# Patient Record
Sex: Female | Born: 1995 | Race: Black or African American | Hispanic: No | Marital: Married | Smoking: Never smoker
Health system: Southern US, Community
[De-identification: ages and names within clinical notes are randomized; demographics above are authoritative.]

## PROBLEM LIST (undated history)

## (undated) DIAGNOSIS — D509 Iron deficiency anemia, unspecified: Secondary | ICD-10-CM

## (undated) DIAGNOSIS — I10 Essential (primary) hypertension: Secondary | ICD-10-CM

## (undated) DIAGNOSIS — R8271 Bacteriuria: Secondary | ICD-10-CM

## (undated) DIAGNOSIS — O139 Gestational [pregnancy-induced] hypertension without significant proteinuria, unspecified trimester: Secondary | ICD-10-CM

## (undated) DIAGNOSIS — A749 Chlamydial infection, unspecified: Secondary | ICD-10-CM

## (undated) HISTORY — DX: Gestational (pregnancy-induced) hypertension without significant proteinuria, unspecified trimester: O13.9

---

## 2013-05-11 ENCOUNTER — Encounter: Payer: Self-pay | Admitting: Obstetrics & Gynecology

## 2013-05-11 ENCOUNTER — Ambulatory Visit (INDEPENDENT_AMBULATORY_CARE_PROVIDER_SITE_OTHER): Payer: Medicaid Other | Admitting: Obstetrics & Gynecology

## 2013-05-11 VITALS — BP 127/82 | Temp 98.6°F | Ht 60.0 in | Wt 148.0 lb

## 2013-05-11 DIAGNOSIS — Z3402 Encounter for supervision of normal first pregnancy, second trimester: Secondary | ICD-10-CM

## 2013-05-11 DIAGNOSIS — Z3400 Encounter for supervision of normal first pregnancy, unspecified trimester: Secondary | ICD-10-CM | POA: Insufficient documentation

## 2013-05-11 LAB — POCT URINALYSIS DIPSTICK
Ketones, UA: NEGATIVE
Spec Grav, UA: 1.02
Urobilinogen, UA: NEGATIVE
pH, UA: 7

## 2013-05-11 MED ORDER — SULFAMETHOXAZOLE-TRIMETHOPRIM 800-160 MG PO TABS: 1.0000 | ORAL_TABLET | Freq: Two times a day (BID) | ORAL | Status: DC

## 2013-05-11 NOTE — Patient Instructions (Addendum)
CF Gene Mutation Testing This is a test used to detect cystic fibrosis (CF) genetic mutations to establish CF carrier status or to establish the diagnosis of CF in an individual. The CF gene mutation test identifies mutations in the CFTR gene on chromosome 7. Each cell in the human body (except sperm and eggs) has 46 chromosomes (23 inherited from the mother and 23 from the father). Genes on these chromosomes form the body's blueprint for producing proteins that control body functions. Cystic fibrosis is caused by a mutation in a pair of genes located on chromosomes 7. Both copies of this gene must be abnormal to cause CF. If only one copy of the gene pair is mutated, the patient will be a carrier. Carriers are not ill, they do not have any symptoms, but they can pass their abnormal CF gene copy on to their children.  When a newborn infant has meconium ileus (no stools in the first 24 to 48 hours of life) or when a person has symptoms of CF (salty sweat, persistent respiratory infections, wheezing, persistent diarrhea, foul-smelling greasy stools, malnutrition, and vitamin deficiency); if a person has a positive sweat chloride or IRT test or a close relative who has been diagnosed with CF; when a patient is undergoing genetic counseling and wants to find out if they are a CF carrier; or for prenatal diagnosis. PREPARATION FOR TEST A blood sample drawn from an infant's heel; a spot of blood that is put onto filter paper; or a blood sample is drawn from a vein in the arm. NORMAL FINDINGS No genetic mutation. Ranges for normal findings may vary among different laboratories and hospitals. You should always check with your doctor after having lab work or other tests done to discuss the meaning of your test results and whether your values are considered within normal limits. MEANING OF TEST Your caregiver will go over the test results with you and discuss the importance and meaning of your results, as well as  treatment options and the need for additional tests if necessary. OBTAINING THE TEST RESULTS It is your responsibility to obtain your test results. Ask the lab or department performing the test when and how you will get your results. Document Released: 11/29/2004 Document Revised: 01/28/2012 Document Reviewed: 10/13/2008 [employer] Patient Information 2014 [employer]. AFP Maternal This is a routine screen (tests) used to check for fetal abnormalities such as Down syndrome and neural tube defects. Down Syndrome is a chromosomal abnormality, sometimes called Trisomy 21. Neural tube defects are serious birth defects. The brain, spinal cord, or their coverings do not develop completely. Women should be tested in the 15th to 20th week of pregnancy. The msAFP screen involves three or four tests that measure substances found in the blood that make the testing better. During development, AFP levels in fetal blood and amniotic fluid rise until about 12 weeks. The levels then gradually fall until birth. AFP is a protein produce by fetal tissue. AFP crosses the placenta and appears in the maternal blood. A baby with an open neural tube defect has an opening in its spine, head, or abdominal wall that allows higher-than-usual amounts of AFP to pass into the mother's blood. If a screen is positive, more tests are needed to make a diagnosis. These include ultrasound and perhaps amniocentesis (checking the fluid that surrounds the baby). These tests are used to help women and their caregivers make decisions about the management of their pregnancies. In pregnancies where the fetus is carrying the chromosomal   defect that results in Down syndrome, the levels of AFP and unconjugated estriol tend to be low and hCG and inhibin A levels high.  PREPARATION FOR TEST Blood is drawn from a vein in your arm usually between the 15th and 20th weeks of pregnancy. Four different tests on your blood are done. These are AFP, hCG,  unconjugated estriol, and inhibin A. The combination of tests produces a more accurate result. NORMAL FINDINGS   Adult: less than 40ng/mL or less than 40 mg/L (SI units)  Child younger than1 year: less than 30 ng/mL Ranges are stratified by weeks of gestation and vary among laboratories. Ranges for normal findings may vary among different laboratories and hospitals. You should always check with your doctor after having lab work or other tests done to discuss the meaning of your test results and whether your values are considered within normal limits. MEANING OF TEST  These are screening tests. Not all fetal abnormalities will give positive test results. Of all women who have positive AFP screening results, only a very small number of them have babies who actually have a neural tube defect or chromosomal abnormality. Your caregiver will go over the test results with you and discuss the importance and meaning of your results, as well as treatment options and the need for additional tests if necessary. OBTAINING THE TEST RESULTS It is your responsibility to obtain your test results. Ask the lab or department performing the test when and how you will get your results. Document Released: 11/27/2004 Document Revised: 01/28/2012 Document Reviewed: 10/09/2008 [employer] Patient Information 2014 [employer].  

## 2013-05-11 NOTE — Progress Notes (Signed)
Pulse- 105 . Subjective:    Doris Lowe is being seen today for her first obstetrical visit.  This is not a planned pregnancy. She is at [redacted]w[redacted]d gestation. Her obstetrical history is significant for none. Relationship with FOB: significant other, not living together. Patient does intend to breast feed. Pregnancy history fully reviewed.  Menstrual History: OB History   Grav Para Term Preterm Abortions TAB SAB Ect Mult Living   1               Menarche age: 17 Patient's last menstrual period was 02/07/2013.    The following portions of the patient's history were reviewed and updated as appropriate: allergies, current medications, past family history, past medical history, past social history, past surgical history and problem list.  Review of Systems Pertinent items are noted in HPI.    Objective:   General Appearance:    Alert, cooperative, no distress, appears stated age  Head:    Normocephalic, without obvious abnormality, atraumatic  Eyes:    PERRL, conjunctiva/corneas clear, EOM's intact, fundi    benign, both eyes  Ears:    Normal TM's and external ear canals, both ears  Nose:   Nares normal, septum midline, mucosa normal, no drainage    or sinus tenderness  Throat:   Lips, mucosa, and tongue normal; teeth and gums normal  Neck:   Supple, symmetrical, trachea midline, no adenopathy;    thyroid:  no enlargement/tenderness/nodules; no carotid   bruit or JVD  Back:     Symmetric, no curvature, ROM normal, no CVA tenderness  Lungs:     Clear to auscultation bilaterally, respirations unlabored  Chest Wall:    No tenderness or deformity   Heart:    Regular rate and rhythm, S1 and S2 normal, no murmur, rub   or gallop  Breast Exam:    No tenderness, masses, or nipple abnormality  Abdomen:     Soft, non-tender, bowel sounds active all four quadrants,    no masses, no organomegaly  Genitalia:    Normal female without lesion, discharge or tenderness  Extremities:   Extremities  normal, atraumatic, no cyanosis or edema  Pulses:   2+ and symmetric all extremities  Skin:   Skin color, texture, turgor normal, no rashes or lesions  Lymph nodes:   Cervical, supraclavicular, and axillary nodes normal  Neurologic:   CNII-XII intact, normal strength, sensation and reflexes    throughout    Assessment:    Pregnancy at [redacted]w[redacted]d weeks    Plan:    Initial labs drawn. Prenatal vitamins. Problem list reviewed and updated. AFP3 discussed: undecided. Role of ultrasound in pregnancy discussed; fetal survey: schedule next visit. Amniocentesis discussed: not indicated. Follow up in 4 weeks. 50% of 20 min visit spent on counseling and coordination of care.

## 2013-05-12 LAB — OBSTETRIC PANEL
Eosinophils Absolute: 0.1 10*3/uL (ref 0.0–0.7)
Eosinophils Relative: 0 % (ref 0–5)
HCT: 33.8 % — ABNORMAL LOW (ref 36.0–46.0)
Hemoglobin: 10.9 g/dL — ABNORMAL LOW (ref 12.0–15.0)
Lymphocytes Relative: 14 % (ref 12–46)
Lymphs Abs: 2.1 10*3/uL (ref 0.7–4.0)
MCH: 26.1 pg (ref 26.0–34.0)
MCV: 81.1 fL (ref 78.0–100.0)
Monocytes Absolute: 1.2 10*3/uL — ABNORMAL HIGH (ref 0.1–1.0)
Monocytes Relative: 8 % (ref 3–12)
RBC: 4.17 MIL/uL (ref 3.87–5.11)
Rh Type: POSITIVE
Rubella: 3.21 Index — ABNORMAL HIGH (ref <0.90)
WBC: 15.1 10*3/uL — ABNORMAL HIGH (ref 4.0–10.5)

## 2013-05-12 LAB — VITAMIN D 25 HYDROXY (VIT D DEFICIENCY, FRACTURES): Vit D, 25-Hydroxy: 31 ng/mL (ref 30–89)

## 2013-05-12 LAB — HIV ANTIBODY (ROUTINE TESTING W REFLEX): HIV: NONREACTIVE

## 2013-05-12 LAB — GC/CHLAMYDIA PROBE AMP
CT Probe RNA: POSITIVE — AB
GC Probe RNA: NEGATIVE

## 2013-05-12 LAB — WET PREP BY MOLECULAR PROBE: Candida species: NEGATIVE

## 2013-05-13 LAB — CULTURE, OB URINE: Colony Count: >=100000

## 2013-05-13 LAB — HEMOGLOBINOPATHY EVALUATION
Hgb A: 97.4 % (ref 96.8–97.8)
Hgb S Quant: 0 %

## 2013-05-15 ENCOUNTER — Encounter: Payer: Self-pay | Admitting: Obstetrics & Gynecology

## 2013-05-15 DIAGNOSIS — R8271 Bacteriuria: Secondary | ICD-10-CM | POA: Insufficient documentation

## 2013-05-15 DIAGNOSIS — O98819 Other maternal infectious and parasitic diseases complicating pregnancy, unspecified trimester: Secondary | ICD-10-CM

## 2013-05-15 DIAGNOSIS — A749 Chlamydial infection, unspecified: Secondary | ICD-10-CM | POA: Insufficient documentation

## 2013-05-15 DIAGNOSIS — O9989 Other specified diseases and conditions complicating pregnancy, childbirth and the puerperium: Secondary | ICD-10-CM

## 2013-05-21 ENCOUNTER — Telehealth: Payer: Self-pay | Admitting: *Deleted

## 2013-05-21 DIAGNOSIS — A749 Chlamydial infection, unspecified: Secondary | ICD-10-CM

## 2013-05-21 DIAGNOSIS — N390 Urinary tract infection, site not specified: Secondary | ICD-10-CM

## 2013-05-21 MED ORDER — AMPICILLIN 500 MG PO CAPS: 500.0000 mg | ORAL_CAPSULE | Freq: Four times a day (QID) | ORAL | Status: AC

## 2013-05-21 MED ORDER — AZITHROMYCIN 250 MG PO TABS: 500.0000 mg | ORAL_TABLET | Freq: Once | ORAL | Status: DC

## 2013-05-21 NOTE — Telephone Encounter (Signed)
Patient notified regarding test results- unable to reach pharmacy on phone- medication Escribed

## 2013-06-08 ENCOUNTER — Other Ambulatory Visit: Payer: Self-pay

## 2013-06-08 ENCOUNTER — Ambulatory Visit (INDEPENDENT_AMBULATORY_CARE_PROVIDER_SITE_OTHER): Payer: Medicaid Other | Admitting: Obstetrics & Gynecology

## 2013-06-08 VITALS — BP 126/78 | Temp 98.8°F | Wt 150.0 lb

## 2013-06-08 DIAGNOSIS — Z3402 Encounter for supervision of normal first pregnancy, second trimester: Secondary | ICD-10-CM

## 2013-06-08 DIAGNOSIS — Z3400 Encounter for supervision of normal first pregnancy, unspecified trimester: Secondary | ICD-10-CM

## 2013-06-08 LAB — POCT URINALYSIS DIPSTICK
Bilirubin, UA: NEGATIVE
Blood, UA: NEGATIVE
Leukocytes, UA: NEGATIVE
Nitrite, UA: NEGATIVE
Urobilinogen, UA: NEGATIVE
pH, UA: 6

## 2013-06-08 NOTE — Progress Notes (Signed)
P- 98 Pt states she has had a few cramps here and there.

## 2013-06-08 NOTE — Patient Instructions (Addendum)
CF Gene Mutation Testing This is a test used to detect cystic fibrosis (CF) genetic mutations to establish CF carrier status or to establish the diagnosis of CF in an individual. The CF gene mutation test identifies mutations in the CFTR gene on chromosome 7. Each cell in the human body (except sperm and eggs) has 46 chromosomes (23 inherited from the mother and 23 from the father). Genes on these chromosomes form the body's blueprint for producing proteins that control body functions. Cystic fibrosis is caused by a mutation in a pair of genes located on chromosomes 7. Both copies of this gene must be abnormal to cause CF. If only one copy of the gene pair is mutated, the patient will be a carrier. Carriers are not ill, they do not have any symptoms, but they can pass their abnormal CF gene copy on to their children.  When a newborn infant has meconium ileus (no stools in the first 24 to 48 hours of life) or when a person has symptoms of CF (salty sweat, persistent respiratory infections, wheezing, persistent diarrhea, foul-smelling greasy stools, malnutrition, and vitamin deficiency); if a person has a positive sweat chloride or IRT test or a close relative who has been diagnosed with CF; when a patient is undergoing genetic counseling and wants to find out if they are a CF carrier; or for prenatal diagnosis. PREPARATION FOR TEST A blood sample drawn from an infant's heel; a spot of blood that is put onto filter paper; or a blood sample is drawn from a vein in the arm. NORMAL FINDINGS No genetic mutation. Ranges for normal findings may vary among different laboratories and hospitals. You should always check with your doctor after having lab work or other tests done to discuss the meaning of your test results and whether your values are considered within normal limits. MEANING OF TEST Your caregiver will go over the test results with you and discuss the importance and meaning of your results, as well as  treatment options and the need for additional tests if necessary. OBTAINING THE TEST RESULTS It is your responsibility to obtain your test results. Ask the lab or department performing the test when and how you will get your results. Document Released: 11/29/2004 Document Revised: 01/28/2012 Document Reviewed: 10/13/2008 [employer] Patient Information 2014 [employer]. AFP Maternal This is a routine screen (tests) used to check for fetal abnormalities such as Down syndrome and neural tube defects. Down Syndrome is a chromosomal abnormality, sometimes called Trisomy 21. Neural tube defects are serious birth defects. The brain, spinal cord, or their coverings do not develop completely. Women should be tested in the 15th to 20th week of pregnancy. The msAFP screen involves three or four tests that measure substances found in the blood that make the testing better. During development, AFP levels in fetal blood and amniotic fluid rise until about 12 weeks. The levels then gradually fall until birth. AFP is a protein produce by fetal tissue. AFP crosses the placenta and appears in the maternal blood. A baby with an open neural tube defect has an opening in its spine, head, or abdominal wall that allows higher-than-usual amounts of AFP to pass into the mother's blood. If a screen is positive, more tests are needed to make a diagnosis. These include ultrasound and perhaps amniocentesis (checking the fluid that surrounds the baby). These tests are used to help women and their caregivers make decisions about the management of their pregnancies. In pregnancies where the fetus is carrying the chromosomal   defect that results in Down syndrome, the levels of AFP and unconjugated estriol tend to be low and hCG and inhibin A levels high.  PREPARATION FOR TEST Blood is drawn from a vein in your arm usually between the 15th and 20th weeks of pregnancy. Four different tests on your blood are done. These are AFP, hCG,  unconjugated estriol, and inhibin A. The combination of tests produces a more accurate result. NORMAL FINDINGS   Adult: less than 40ng/mL or less than 40 mg/L (SI units)  Child younger than1 year: less than 30 ng/mL Ranges are stratified by weeks of gestation and vary among laboratories. Ranges for normal findings may vary among different laboratories and hospitals. You should always check with your doctor after having lab work or other tests done to discuss the meaning of your test results and whether your values are considered within normal limits. MEANING OF TEST  These are screening tests. Not all fetal abnormalities will give positive test results. Of all women who have positive AFP screening results, only a very small number of them have babies who actually have a neural tube defect or chromosomal abnormality. Your caregiver will go over the test results with you and discuss the importance and meaning of your results, as well as treatment options and the need for additional tests if necessary. OBTAINING THE TEST RESULTS It is your responsibility to obtain your test results. Ask the lab or department performing the test when and how you will get your results. Document Released: 11/27/2004 Document Revised: 01/28/2012 Document Reviewed: 10/09/2008 [employer] Patient Information 2014 [employer].  

## 2013-06-09 ENCOUNTER — Encounter: Payer: Self-pay | Admitting: Obstetrics & Gynecology

## 2013-06-09 NOTE — Progress Notes (Signed)
Doing well 

## 2013-06-10 LAB — CYSTIC FIBROSIS DIAGNOSTIC STUDY

## 2013-06-16 ENCOUNTER — Ambulatory Visit (INDEPENDENT_AMBULATORY_CARE_PROVIDER_SITE_OTHER): Payer: Medicaid Other

## 2013-06-16 ENCOUNTER — Encounter: Payer: Self-pay | Admitting: Obstetrics & Gynecology

## 2013-06-16 ENCOUNTER — Other Ambulatory Visit: Payer: Self-pay | Admitting: *Deleted

## 2013-06-16 DIAGNOSIS — O280 Abnormal hematological finding on antenatal screening of mother: Secondary | ICD-10-CM | POA: Insufficient documentation

## 2013-06-16 DIAGNOSIS — Z3402 Encounter for supervision of normal first pregnancy, second trimester: Secondary | ICD-10-CM

## 2013-06-16 DIAGNOSIS — Z3400 Encounter for supervision of normal first pregnancy, unspecified trimester: Secondary | ICD-10-CM

## 2013-06-17 LAB — AFP, QUAD SCREEN
AFP: 22.7 IU/mL
Age Alone: 1:1190 {titer}
Curr Gest Age: 15.4 wks.days
Down Syndrome Scr Risk Est: 1:113 {titer}
INH: 327 pg/mL
MoM for INH: 1.7
Osb Risk: <1:27300 {titer}
uE3 Mom: 0.64

## 2013-06-26 ENCOUNTER — Encounter: Payer: Self-pay | Admitting: Obstetrics & Gynecology

## 2013-06-26 LAB — US OB DETAIL + 14 WK

## 2013-07-02 ENCOUNTER — Ambulatory Visit (INDEPENDENT_AMBULATORY_CARE_PROVIDER_SITE_OTHER): Payer: Medicaid Other | Admitting: Obstetrics & Gynecology

## 2013-07-02 ENCOUNTER — Encounter: Payer: Self-pay | Admitting: Obstetrics & Gynecology

## 2013-07-02 VITALS — BP 130/83 | Temp 98.5°F | Wt 156.8 lb

## 2013-07-02 DIAGNOSIS — Z3400 Encounter for supervision of normal first pregnancy, unspecified trimester: Secondary | ICD-10-CM

## 2013-07-02 DIAGNOSIS — Z3402 Encounter for supervision of normal first pregnancy, second trimester: Secondary | ICD-10-CM

## 2013-07-02 LAB — POCT URINALYSIS DIPSTICK
Blood, UA: NEGATIVE
Clarity, UA: NEGATIVE
Ketones, UA: NEGATIVE
Protein, UA: NEGATIVE
Spec Grav, UA: 1.015
Urobilinogen, UA: NEGATIVE
pH, UA: 8

## 2013-07-02 NOTE — Progress Notes (Signed)
Pulse: 108

## 2013-07-06 ENCOUNTER — Other Ambulatory Visit: Payer: Self-pay | Admitting: *Deleted

## 2013-07-06 ENCOUNTER — Encounter: Payer: Self-pay | Admitting: Obstetrics & Gynecology

## 2013-07-06 DIAGNOSIS — Z3689 Encounter for other specified antenatal screening: Secondary | ICD-10-CM

## 2013-07-06 NOTE — Progress Notes (Signed)
Referral-->genetics counseling. Possible candidate for NIPT.

## 2013-07-06 NOTE — Patient Instructions (Signed)
Genetic Counseling Genetic counseling helps people look at the risks of genetic problems in pregnancy. Genetic problems are problems that may be passed from the mother and father to their child.  Genetic counseling is done:  To see if a couple is at risk of passing on a problem.  To explain the cause of a disorder if present.  To understand the odds passing it on to the baby.  To determine what can be done medically.  To use in family planning to prevent or avoid a problem. Genetic counseling can be done by:  A geneticist.  A physician with special training in genetics.  Genetic counselors. The following are reasons to seek a referral for genetic counseling and/or genetic evaluation with a genetic physician: FAMILY HISTORY FACTORS   Mental retardation.  Neural tube defects (such as spina bifida).  Chromosome abnormalities (such as Down syndrome, Fragile X syndrome).  Cleft lip/cleft palate.  Heart defects.  Short stature.  Single gene defects (such as cystic fibrosis or PKU).  Hearing or visual impairments.  Learning disabilities.  Psychiatric disorders.  Cancers (breast, uterus, ovary and intestine).  Multiple pregnancy losses (miscarriages, stillbirths, or infant deaths).  Other disorders which could be considered genetic.  Either parent with a dominant disorder or any disorder seen in several generations including:  High blood pressure.  High cholesterol.  Muscular dystrophy.  Huntington's chorea.  Polycystic kidney. IF BOTH PARENTS ARE CARRIERS FOR:   Depression.  Seizures (convulsions).  Alcoholism.  Diabetes.  Thyroid disorder.  Others in which birth defects may be associated either with the disease process or with common medications prescribed for the disease.  Fetal or parental exposure to potentially harmful agents. These include:  Drugs.  Chemicals.  Infection.  Radiation.  Infertility cases where either parent is suspected  of having a chromosome abnormality.  Couples requiring assisted reproductive techniques to achieve a pregnancy, or individuals donating eggs or sperm for those purposes. OTHER FACTORS   Persons in specific ethnic groups or geographic areas with a higher incidence of certain disorders. These include Tay Sachs disease, sickle cell disease, or thalassemia.  Extreme parental concern or fear of having a child with a birth defect.  Parents are blood relatives (consanguinity) or incest in a pregnancy is involved.  Premarital or preconception counseling in couples at high risk for genetic disorders based on family or personal medical history.  The birth of an affected child or by carrier screening.  Mother, known, or presumed carrier of an X-linked disorder such as hemophilia.  Either parent is a known carrier of a balanced chromosome abnormality.  A previous baby with a genetic disorder.  After the baby is born, you find out the baby has a genetic disorder. PREGNANCY FACTORS   The mother is over 35 years old a the time of delivery.  Maternal serum screening indicating an increased risk for neural tube defects, Down Syndrome, or trisomy 18.  Abnormal prenatal diagnostic test results or abnormal prenatal ultrasound examination.  Maternal factors such as:  Schizophrenia.  Seizures.  Depression.  Alcoholism.  Diabetes.  Thyroid disorder.  Other factors in which birth defects may be associated either with the disease process or with common medications prescribed for the disease.  Fetal or parental exposure to potentially harmful agents such as:  Drugs.  Chemicals.  Radiation.  Infection.  The father is over the age of 55 years at the time of conception.  Infertility cases where either parent is suspected of having a chromosome abnormality.    Couples requiring assisted reproductive techniques to achieve a pregnancy, or individuals donating eggs or sperm for those  purposes.  Recurrent pregnancy loss.  Stillborn baby. FINDING A GENETIC COUNSELOR Most caregivers know and have a list of genetic counselors. Your caregiver can refer you to one of them. You can also contact the National Society of Genetic Counselors for their locations and for more information.  AFTER RECEIVING GENETIC COUNSELING Genetic counselors can help you understand what options you have. They can help you understand what to do next. They can share the experiences of other couples who had babies with genetic problems. If you are at high risk for having a baby with a severe or fatal genetic disorder, you have several options:  Have in vitro fertilization with healthy eggs that were fertilized in vitro.  Use donor sperm or eggs.  Adopt a baby. Genetic counselors can also help if you find out when you are pregnant that the baby has a severe or fatal genetic disorder. Some options are:  Help you prepare for a particular disorder by speaking to:  Specialists in caring for Down syndrome.  Surgeon for heart defects.  Social workers.  Support groups.  Fetal surgery is sometimes possible and helpful to treat some genetic defects.  Ending the pregnancy. Document Released: 01/26/2003 Document Revised: 01/28/2012 Document Reviewed: 12/17/2007 [employer] Patient Information 2014 [employer].  

## 2013-07-07 ENCOUNTER — Ambulatory Visit (INDEPENDENT_AMBULATORY_CARE_PROVIDER_SITE_OTHER): Payer: Medicaid Other

## 2013-07-07 DIAGNOSIS — Z3689 Encounter for other specified antenatal screening: Secondary | ICD-10-CM

## 2013-07-07 DIAGNOSIS — Z3400 Encounter for supervision of normal first pregnancy, unspecified trimester: Secondary | ICD-10-CM

## 2013-07-08 ENCOUNTER — Encounter: Payer: Self-pay | Admitting: Obstetrics & Gynecology

## 2013-07-08 LAB — US OB DETAIL + 14 WK

## 2013-07-09 ENCOUNTER — Encounter (HOSPITAL_COMMUNITY): Payer: Self-pay

## 2013-07-09 ENCOUNTER — Encounter (HOSPITAL_COMMUNITY): Payer: Self-pay | Admitting: Obstetrics

## 2013-07-09 ENCOUNTER — Other Ambulatory Visit (HOSPITAL_COMMUNITY): Payer: Self-pay

## 2013-07-14 ENCOUNTER — Other Ambulatory Visit: Payer: Self-pay | Admitting: Obstetrics & Gynecology

## 2013-07-14 DIAGNOSIS — O280 Abnormal hematological finding on antenatal screening of mother: Secondary | ICD-10-CM

## 2013-07-17 ENCOUNTER — Ambulatory Visit (HOSPITAL_COMMUNITY)
Admission: RE | Admit: 2013-07-17 | Discharge: 2013-07-17 | Disposition: A | Discharge status: Home / Self Care | Payer: Medicaid Other | Source: Ambulatory Visit | Attending: Obstetrics | Admitting: Obstetrics

## 2013-07-17 DIAGNOSIS — O289 Unspecified abnormal findings on antenatal screening of mother: Secondary | ICD-10-CM | POA: Insufficient documentation

## 2013-07-17 DIAGNOSIS — O280 Abnormal hematological finding on antenatal screening of mother: Secondary | ICD-10-CM

## 2013-07-17 DIAGNOSIS — Z363 Encounter for antenatal screening for malformations: Secondary | ICD-10-CM | POA: Insufficient documentation

## 2013-07-17 DIAGNOSIS — Z1389 Encounter for screening for other disorder: Secondary | ICD-10-CM | POA: Insufficient documentation

## 2013-07-17 DIAGNOSIS — O358XX0 Maternal care for other (suspected) fetal abnormality and damage, not applicable or unspecified: Secondary | ICD-10-CM | POA: Insufficient documentation

## 2013-07-17 IMAGING — US US OB DETAIL+14 WK
1 series · 12 of 28 positions shown · non-contrast
Comparison: none

[Series 1: us ob detail+14 wk · 0.23mm/px · 12 of 87 slices shown]
[im 4/87]
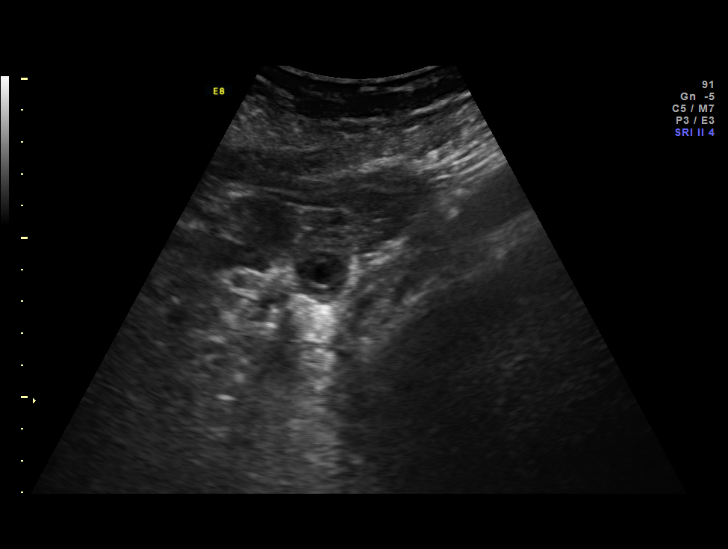
[im 10/87]
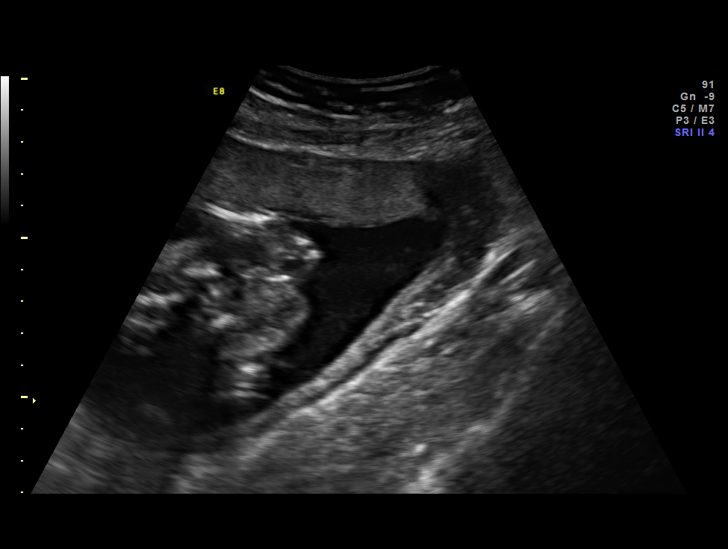
[im 16/87]
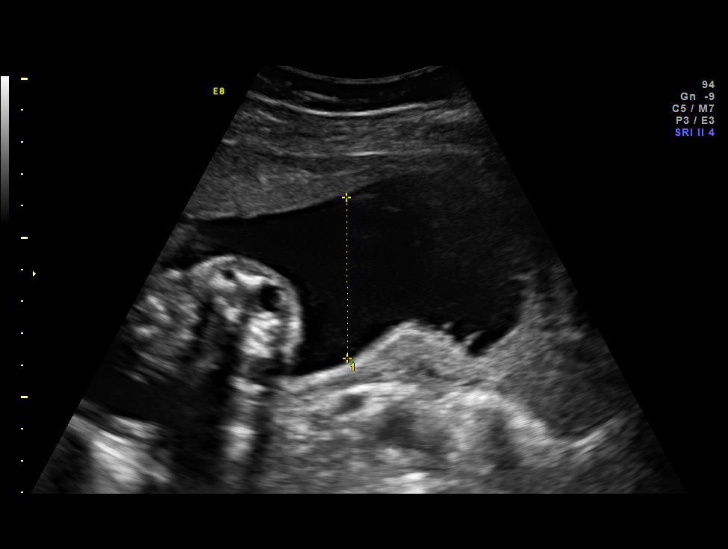
[im 26/87]
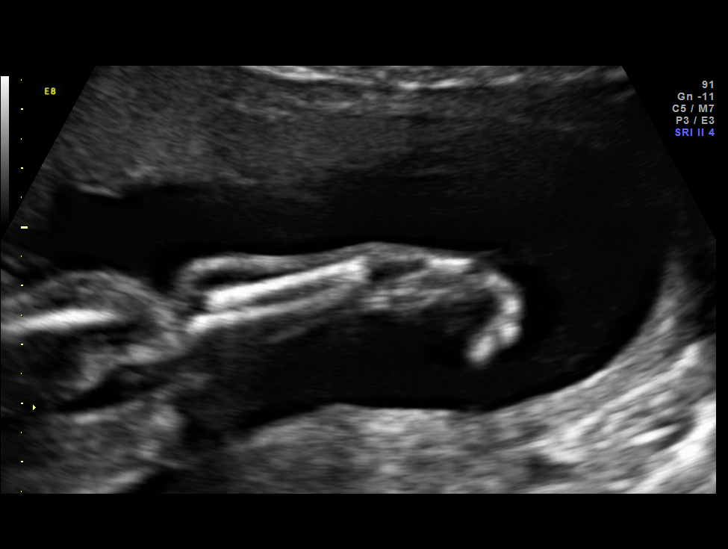
[im 32/87]
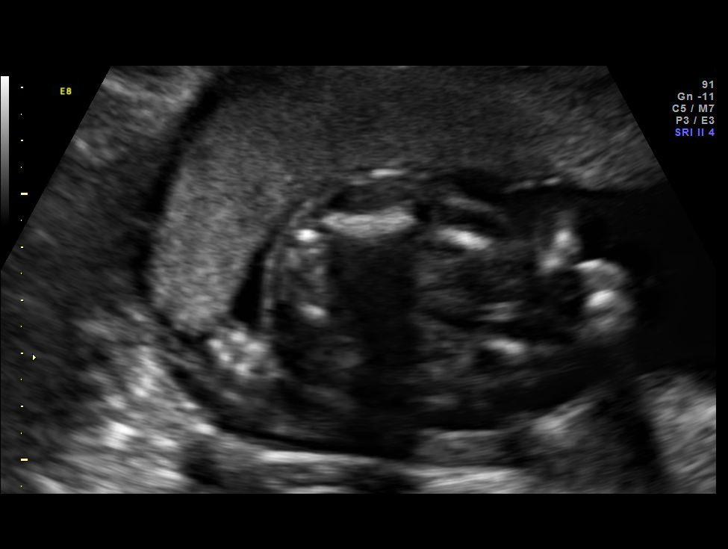
[im 39/87]
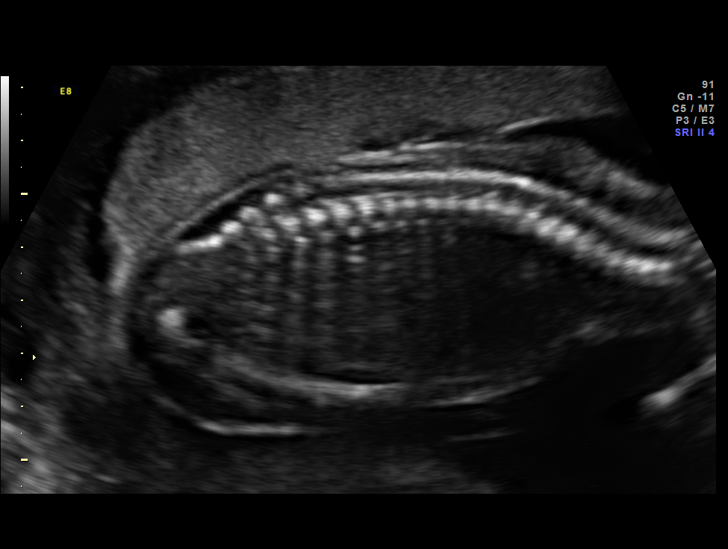
[im 48/87]
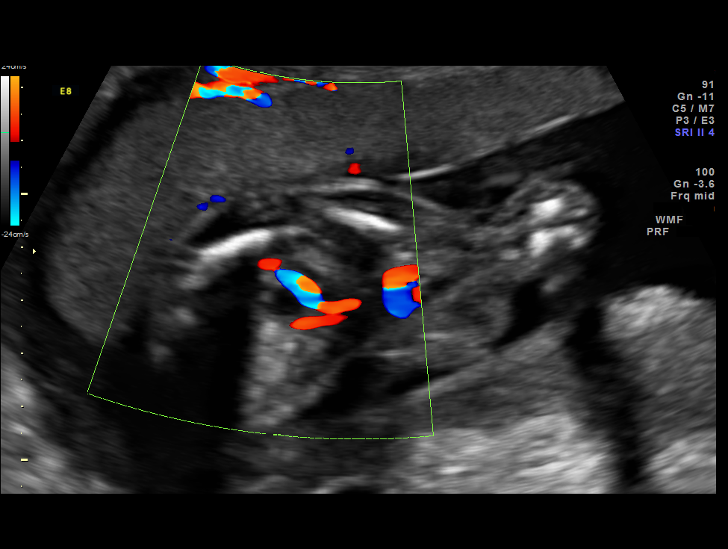
[im 55/87]
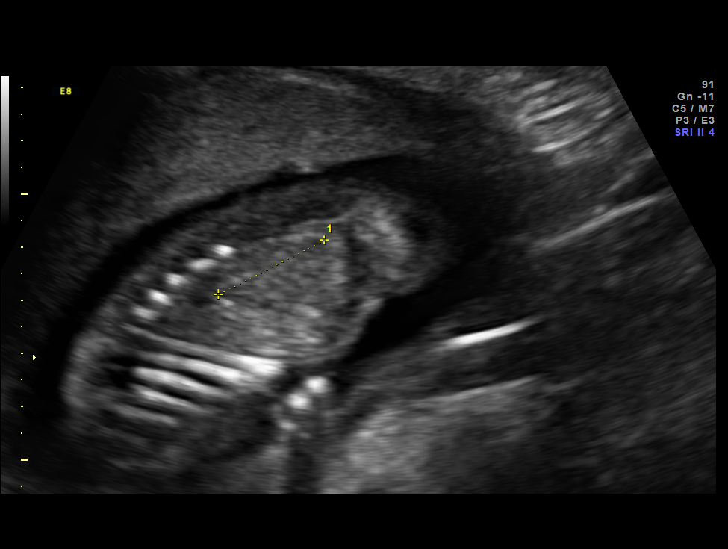
[im 61/87]
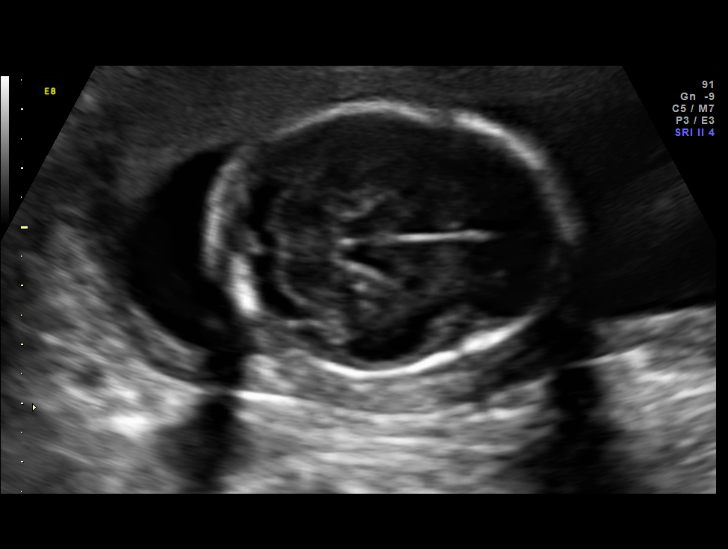
[im 71/87]
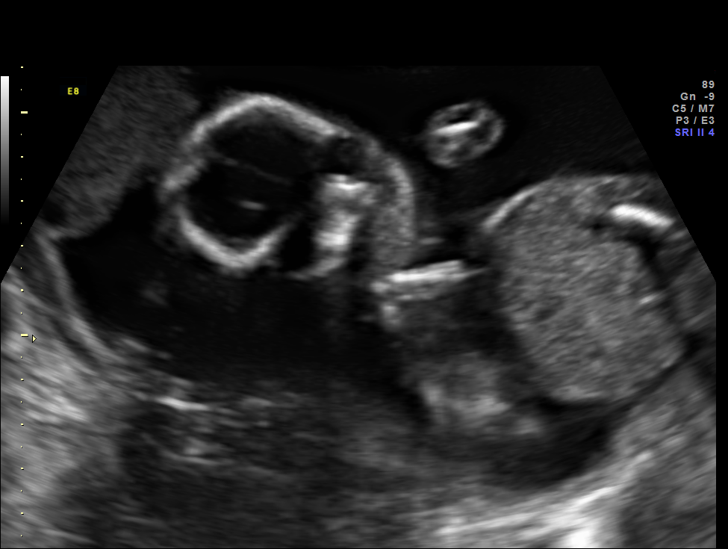
[im 77/87]
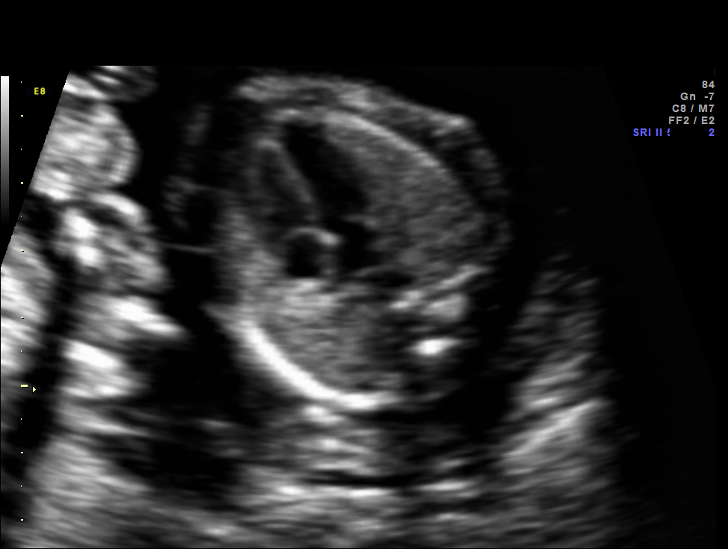
[im 83/87]
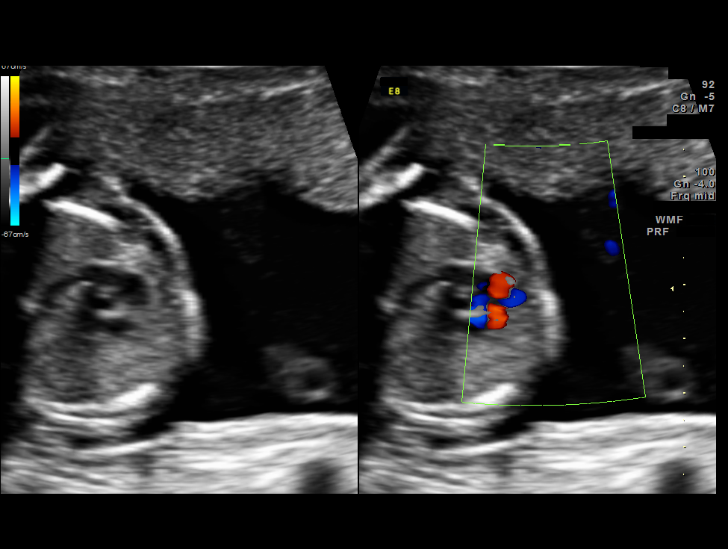

[12 of 28 positions shown; findings below may reference images not displayed]

OBSTETRICS REPORT
                      (Signed Final [DATE] [DATE])

Service(s) Provided

 US OB DETAIL + 14 WK                                  76811.0
Indications

 Detailed fetal anatomic survey                        655.83 [RR]
 Abnormal biochemical screen (quad) for Trisomy
 21
Fetal Evaluation

 Num Of Fetuses:    1
 Fetal Heart Rate:  125                          bpm
 Cardiac Activity:  Observed
 Presentation:      Breech
 Placenta:          Anterior, above cervical os
 P. Cord            Visualized
 Insertion:

 Amniotic Fluid
 AFI FV:      Subjectively within normal limits
                                             Larg Pckt:     5.1  cm
Biometry

 BPD:     46.3  mm     G. Age:  20w 0d                CI:         71.1   70 - 86
 OFD:     65.1  mm                                    FL/HC:      19.5   15.9 -

 HC:     179.2  mm     G. Age:  20w 2d       13  %    HC/AC:      1.10   1.06 -

 AC:     162.4  mm     G. Age:  21w 2d       49  %    FL/BPD:
 FL:      34.9  mm     G. Age:  21w 0d       36  %    FL/AC:      21.5   20 - 24
 HUM:     33.3  mm     G. Age:  21w 2d       50  %

 Est. FW:     396  gm    0 lb 14 oz      42  %
Gestational Age

 LMP:           22w 6d        Date:  [DATE]                 EDD:   [DATE]
 U/S Today:     20w 5d                                        EDD:   [DATE]
 Best:          21w 1d     Det. By:  Early Ultrasound         EDD:   [DATE]
                                     ([DATE])
Anatomy
 Cranium:          Appears normal         Aortic Arch:      Appears normal
 Fetal Cavum:      Appears normal         Diaphragm:        Appears normal
 Ventricles:       Appears normal         Stomach:          Appears normal, left
                                                            sided
 Choroid Plexus:   Appears normal         Abdomen:          Appears normal
 Cerebellum:       Appears normal         Abdominal Wall:   Appears nml (cord
                                                            insert, abd wall)
 Posterior Fossa:  Appears normal         Cord Vessels:     Appears normal (3
                                                            vessel cord)
 Nuchal Fold:      Not applicable (>20    Kidneys:          Appear normal
                   wks GA)
 Face:             Orbits appear          Bladder:          Appears normal
                   normal
 Lips:             Appears normal         Spine:            Appears normal
 Heart:            Appears normal         Lower             Appears normal
                   (4CH, axis, and        Extremities:
                   situs)
 RVOT:             Appears normal         Upper             Appears normal
                                          Extremities:

 Other:  Fetus appears to be a female. Heels and 5th digit appear normal.
Cervix Uterus Adnexa

 Cervical Length:    4.9      cm

 Cervix:       Normal appearance by transabdominal scan. Appears
               closed, without funnelling.

 Left Ovary:    Within normal limits.
 Right Ovary:   Within normal limits.
Impression

 Single IUP at 21 [DATE] weeks
 Increased DSR ([DATE]) by quad screen
 Normal detailed fetal anatomy
 No markers associated with aneuploidy noted
 Normal amniotic fluid volume

 After counseling, the patient declined further genetic testing.
Recommendations

 Follow-up ultrasounds as clinically indicated.

 questions or concerns.

## 2013-07-17 NOTE — Progress Notes (Signed)
Genetic Counseling  High-Risk Gestation Note  Appointment Date:  07/17/2013 Referred By: Brock Bad, MD Date of Birth:  [birth year]    Pregnancy History: G1P0 Estimated Date of Delivery: 11/26/13 Estimated Gestational Age: [redacted]w[redacted]d Attending: Alpha Gula, MD   Ms. Doris Lowe was seen for genetic counseling because of an increased risk for fetal Down syndrome based on Quad screening through [facility]. The patient was accompanied by her mother and by her sister-in-law. [facility] assisted with genetic counseling under my direct supervision.   They were counseled regarding the Quad screen result and the associated 1 in 113 risk for fetal Down syndrome.  We reviewed chromosomes, nondisjunction, and the common features and variable prognosis of Down syndrome.  In addition, we reviewed the screen adjusted reduction in risks for trisomy 18 and ONTDs.  We also discussed other explanations for a screen positive result including: differences in maternal metabolism, and normal variation. They understand that this screening is not diagnostic for Down syndrome but provides a risk assessment.  We reviewed available screening options including noninvasive prenatal screening (NIPS)/cell free fetal DNA (cffDNA) testing and detailed ultrasound.  They were counseled that screening tests are used to modify a patient's a priori risk for aneuploidy, typically based on age. This estimate provides a pregnancy specific risk assessment. We reviewed the benefits and limitations of each option. Specifically, we discussed the conditions for which each test screens, the detection rates, and false positive rates of each. They were also counseled regarding diagnostic testing via amniocentesis. We reviewed the approximate 1 in 300-500 risk for complications for amniocentesis, including spontaneous pregnancy loss.   After consideration of all the options, she elected to proceed with targeted ultrasound only  and declined NIPS and amniocentesis.  A complete ultrasound was performed today. The ultrasound report is reported under separate cover. There were no visualized fetal anomalies or markers suggestive of aneuploidy. Diagnostic testing was declined today.  She understands that screening tests cannot rule out all birth defects or genetic syndromes. The patient was advised of this limitation and states she still does not want additional testing at this time.   Both family histories were reviewed and found to be noncontributory for birth defects, intellectual disability, or known genetic conditions. Without further information regarding the provided family history, an accurate genetic risk cannot be calculated. Further genetic counseling is warranted if more information is obtained.  Ms. Doris Lowe denied exposure to environmental toxins or chemical agents. She denied the use of alcohol, tobacco or street drugs. She denied significant viral illnesses during the course of her pregnancy. Her medical and surgical histories were noncontributory.   I counseled Ms. Doris Lowe for approximately 40 minutes regarding the above risks and available options.   Quinn Plowman, MS,  Certified Genetic Counselor 07/17/2013

## 2013-07-17 NOTE — Progress Notes (Signed)
Doris Lowe  was seen today for an ultrasound appointment.  See full report in [facility].  Impression: Single IUP at 21 1/7 weeks Increased DSR (1:113) by quad screen Normal detailed fetal anatomy No markers associated with aneuploidy noted Normal amniotic fluid volume  After counseling, the patient declined further genetic testing.  Recommendations: Follow-up ultrasounds as clinically indicated.   Alpha Gula, MD

## 2013-07-30 ENCOUNTER — Encounter: Payer: Self-pay | Admitting: Obstetrics & Gynecology

## 2013-07-30 ENCOUNTER — Ambulatory Visit (INDEPENDENT_AMBULATORY_CARE_PROVIDER_SITE_OTHER): Payer: Medicaid Other | Admitting: Obstetrics & Gynecology

## 2013-07-30 VITALS — BP 136/83 | Temp 98.3°F | Wt 163.2 lb

## 2013-07-30 DIAGNOSIS — Z3402 Encounter for supervision of normal first pregnancy, second trimester: Secondary | ICD-10-CM

## 2013-07-30 DIAGNOSIS — Z3400 Encounter for supervision of normal first pregnancy, unspecified trimester: Secondary | ICD-10-CM

## 2013-07-30 NOTE — Patient Instructions (Signed)
Influenza Vaccine (Flu Vaccine, Inactivated) 2013 2014 What You Need to Know WHY GET VACCINATED?  Influenza ("flu") is a contagious disease that spreads around the [place] every winter, usually between October and May.  Flu is caused by the influenza virus, and can be spread by coughing, sneezing, and close contact.  Anyone can get flu, but the risk of getting flu is highest among children. Symptoms come on suddenly and may last several days. They can include:  Fever or chills.  Sore throat.  Muscle aches.  Fatigue.  Cough.  Headache.  Runny or stuffy nose. Flu can make some people much sicker than others. These people include young children, people 65 and older, pregnant women, and people with certain health conditions such as heart, lung or kidney disease, or a weakened immune system. Flu vaccine is especially important for these people, and anyone in close contact with them. Flu can also lead to pneumonia, and make existing medical conditions worse. It can cause diarrhea and seizures in children. Each year thousands of people in the [place] die from flu, and many more are hospitalized. Flu vaccine is the best protection we have from flu and its complications. Flu vaccine also helps prevent spreading flu from person to person. INACTIVATED FLU VACCINE There are 2 types of influenza vaccine:  You are getting an inactivated flu vaccine, which does not contain any live influenza virus. It is given by injection with a needle, and often called the "flu shot."  A different live, attenuated (weakened) influenza vaccine is sprayed into the nostrils. This vaccine is described in a separate Vaccine Information Statement. Flu vaccine is recommended every year. Children 6 months through 8 years of age should get 2 doses the first year they get vaccinated. Flu viruses are always changing. Each year's flu vaccine is made to protect from viruses that are most likely to cause disease  that year. While flu vaccine cannot prevent all cases of flu, it is our best defense against the disease. Inactivated flu vaccine protects against 3 or 4 different influenza viruses. It takes about 2 weeks for protection to develop after the vaccination, and protection lasts several months to a year. Some illnesses that are not caused by influenza virus are often mistaken for flu. Flu vaccine will not prevent these illnesses. It can only prevent influenza. A "high-dose" flu vaccine is available for people 65 years of age and older. The person giving you the vaccine can tell you more about it. Some inactivated flu vaccine contains a very small amount of a mercury-based preservative called thimerosal. Studies have shown that thimerosal in vaccines is not harmful, but flu vaccines that do not contain a preservative are available. SOME PEOPLE SHOULD NOT GET THIS VACCINE Tell the person who gives you the vaccine:  If you have any severe (life-threatening) allergies. If you ever had a life-threatening allergic reaction after a dose of flu vaccine, or have a severe allergy to any part of this vaccine, you may be advised not to get a dose. Most, but not all, types of flu vaccine contain a small amount of egg.  If you ever had Guillain Barr Syndrome (a severe paralyzing illness, also called GBS). Some people with a history of GBS should not get this vaccine. This should be discussed with your doctor.  If you are not feeling well. They might suggest waiting until you feel better. But you should come back. RISKS OF A VACCINE REACTION With a vaccine, like any medicine, there   is a chance of side effects. These are usually mild and go away on their own. Serious side effects are also possible, but are very rare. Inactivated flu vaccine does not contain live flu virus, sogetting flu from this vaccine is not possible. Brief fainting spells and related symptoms (such as jerking movements) can happen after any medical  procedure, including vaccination. Sitting or lying down for about 15 minutes after a vaccination can help prevent fainting and injuries caused by falls. Tell your doctor if you feel dizzy or lightheaded, or have vision changes or ringing in the ears. Mild problems following inactivated flu vaccine:  Soreness, redness, or swelling where the shot was given.  Hoarseness; sore, red or itchy eyes; or cough.  Fever.  Aches.  Headache.  Itching.  Fatigue. If these problems occur, they usually begin soon after the shot and last 1 or 2 days. Moderate problems following inactivated flu vaccine:  Young children who get inactivated flu vaccine and pneumococcal vaccine (PCV13) at the same time may be at increased risk for seizures caused by fever. Ask your doctor for more information. Tell your doctor if a child who is getting flu vaccine has ever had a seizure. Severe problems following inactivated flu vaccine:  A severe allergic reaction could occur after any vaccine (estimated less than 1 in a million doses).  There is a small possibility that inactivated flu vaccine could be associated with Guillan Barr Syndrome (GBS), no more than 1 or 2 cases per million people vaccinated. This is much lower than the risk of severe complications from flu, which can be prevented by flu vaccine. The safety of vaccines is always being monitored. For more information, visit: www.cdc.gov/vaccinesafety/ WHAT IF THERE IS A SERIOUS REACTION? What should I look for?  Look for anything that concerns you, such as signs of a severe allergic reaction, very high fever, or behavior changes. Signs of a severe allergic reaction can include hives, swelling of the face and throat, difficulty breathing, a fast heartbeat, dizziness, and weakness. These would start a few minutes to a few hours after the vaccination. What should I do?  If you think it is a severe allergic reaction or other emergency that cannot wait, call 9 1 1  or get the person to the nearest hospital. Otherwise, call your doctor.  Afterward, the reaction should be reported to the Vaccine Adverse Event Reporting System (VAERS). Your doctor might file this report, or you can do it yourself through the VAERS website at www.vaers.hhs.gov, or by calling 1-800-822-7967. VAERS is only for reporting reactions. They do not give medical advice. THE NATIONAL VACCINE INJURY COMPENSATION PROGRAM The National Vaccine Injury Compensation Program (VICP) is a federal program that was created to compensate people who may have been injured by certain vaccines. Persons who believe they may have been injured by a vaccine can learn about the program and about filing a claim by calling 1-800-338-2382 or visiting the VICP website at www.hrsa.gov/vaccinecompensation HOW CAN I LEARN MORE?  Ask your doctor.  Call your local or state health department.  Contact the Centers for Disease Control and Prevention (CDC):  Call 1-800-232-4636 (1-800-CDC-INFO) or  Visit CDC's website at www.cdc.gov/flu CDC Inactivated Influenza Vaccine Interim VIS (06/13/12) Document Released: 08/30/2006 Document Revised: 07/30/2012 Document Reviewed: 06/13/2012 Exit[employer] Patient Information 2014 [employer].  

## 2013-07-30 NOTE — Progress Notes (Signed)
Pulse: 116 

## 2013-07-30 NOTE — Progress Notes (Signed)
Doing well 

## 2013-07-31 LAB — GC/CHLAMYDIA PROBE AMP, URINE: Chlamydia, Swab/Urine, PCR: NEGATIVE

## 2013-08-13 ENCOUNTER — Other Ambulatory Visit: Payer: Medicaid Other

## 2013-08-13 ENCOUNTER — Encounter: Payer: Self-pay | Admitting: Obstetrics & Gynecology

## 2013-08-13 ENCOUNTER — Ambulatory Visit (INDEPENDENT_AMBULATORY_CARE_PROVIDER_SITE_OTHER): Payer: Medicaid Other | Admitting: Obstetrics & Gynecology

## 2013-08-13 VITALS — BP 120/79 | Temp 98.3°F | Wt 166.0 lb

## 2013-08-13 DIAGNOSIS — A749 Chlamydial infection, unspecified: Secondary | ICD-10-CM

## 2013-08-13 DIAGNOSIS — Z3400 Encounter for supervision of normal first pregnancy, unspecified trimester: Secondary | ICD-10-CM

## 2013-08-13 DIAGNOSIS — N390 Urinary tract infection, site not specified: Secondary | ICD-10-CM

## 2013-08-13 DIAGNOSIS — Z3402 Encounter for supervision of normal first pregnancy, second trimester: Secondary | ICD-10-CM

## 2013-08-13 DIAGNOSIS — O98319 Other infections with a predominantly sexual mode of transmission complicating pregnancy, unspecified trimester: Secondary | ICD-10-CM

## 2013-08-13 LAB — POCT URINALYSIS DIPSTICK
Bilirubin, UA: NEGATIVE
Blood, UA: NEGATIVE
Glucose, UA: NEGATIVE
Ketones, UA: NEGATIVE
Spec Grav, UA: 1.01
Urobilinogen, UA: NEGATIVE

## 2013-08-13 LAB — CBC
MCH: 28.9 pg (ref 26.0–34.0)
MCHC: 33.2 g/dL (ref 30.0–36.0)
MCV: 87 fL (ref 78.0–100.0)
Platelets: 359 10*3/uL (ref 150–400)
RBC: 3.7 MIL/uL — ABNORMAL LOW (ref 3.87–5.11)
RDW: 16.7 % — ABNORMAL HIGH (ref 11.5–15.5)

## 2013-08-13 NOTE — Progress Notes (Signed)
Pulse- 99 Nutritionist consult re: excessive weight gain.

## 2013-08-13 NOTE — Patient Instructions (Addendum)

## 2013-08-14 ENCOUNTER — Encounter: Payer: Self-pay | Admitting: Obstetrics & Gynecology

## 2013-08-14 LAB — GLUCOSE TOLERANCE, 2 HOURS W/ 1HR: Glucose, Fasting: 72 mg/dL (ref 70–99)

## 2013-09-10 ENCOUNTER — Encounter: Payer: Self-pay | Admitting: Obstetrics & Gynecology

## 2013-09-10 ENCOUNTER — Ambulatory Visit (INDEPENDENT_AMBULATORY_CARE_PROVIDER_SITE_OTHER): Payer: Medicaid Other | Admitting: Obstetrics & Gynecology

## 2013-09-10 ENCOUNTER — Encounter: Payer: Medicaid Other | Admitting: Obstetrics & Gynecology

## 2013-09-10 VITALS — BP 128/77 | Temp 98.3°F | Wt 173.2 lb

## 2013-09-10 DIAGNOSIS — Z3482 Encounter for supervision of other normal pregnancy, second trimester: Secondary | ICD-10-CM

## 2013-09-10 DIAGNOSIS — Z3480 Encounter for supervision of other normal pregnancy, unspecified trimester: Secondary | ICD-10-CM

## 2013-09-10 LAB — POCT URINALYSIS DIPSTICK
Bilirubin, UA: NEGATIVE
Glucose, UA: NEGATIVE
Protein, UA: NEGATIVE
Spec Grav, UA: 1.015

## 2013-09-10 NOTE — Progress Notes (Signed)
Pulse- 120 Plans to breast feed.  Considering Skyla IUD.  U/S for growth.

## 2013-09-10 NOTE — Patient Instructions (Signed)
Intrauterine Device Information  An intrauterine device (IUD) is inserted into your uterus and prevents pregnancy. There are 2 types of IUDs available:  · Copper IUD. This type of IUD is wrapped in copper wire and is placed inside the uterus. Copper makes the uterus and fallopian tubes produce a fluid that kills sperm. The copper IUD can stay in place for 10 years.  · Hormone IUD. This type of IUD contains the hormone progestin (synthetic progesterone). The hormone thickens the cervical mucus and prevents sperm from entering the uterus, and it also thins the uterine lining to prevent implantation of a fertilized egg. The hormone can weaken or kill the sperm that get into the uterus. The hormone IUD can stay in place for 5 years.  Your caregiver will make sure you are a good candidate for a contraceptive IUD. Discuss with your caregiver the possible side effects.  ADVANTAGES  · It is highly effective, reversible, long-acting, and low maintenance.  · There are no estrogen-related side effects.  · An IUD can be used when breastfeeding.  · It is not associated with weight gain.  · It works immediately after insertion.  · The copper IUD does not interfere with your female hormones.  · The progesterone IUD can make heavy menstrual periods lighter.  · The progesterone IUD can be used for 5 years.  · The copper IUD can be used for 10 years.  DISADVANTAGES  · The progesterone IUD can be associated with irregular bleeding patterns.  · The copper IUD can make your menstrual flow heavier and more painful.  · You may experience cramping and vaginal bleeding after insertion.  Document Released: 10/09/2004 Document Revised: 01/28/2012 Document Reviewed: 03/10/2011  ExitCare® Patient Information ©2014 [employer].

## 2013-09-15 ENCOUNTER — Other Ambulatory Visit: Payer: Self-pay | Admitting: *Deleted

## 2013-09-15 DIAGNOSIS — O365931 Maternal care for other known or suspected poor fetal growth, third trimester, fetus 1: Secondary | ICD-10-CM

## 2013-09-23 ENCOUNTER — Ambulatory Visit (INDEPENDENT_AMBULATORY_CARE_PROVIDER_SITE_OTHER): Payer: Medicaid Other | Admitting: Obstetrics & Gynecology

## 2013-09-23 ENCOUNTER — Other Ambulatory Visit (INDEPENDENT_AMBULATORY_CARE_PROVIDER_SITE_OTHER): Payer: Medicaid Other

## 2013-09-23 ENCOUNTER — Other Ambulatory Visit: Payer: Medicaid Other

## 2013-09-23 VITALS — BP 136/82 | Temp 98.6°F | Wt 176.0 lb

## 2013-09-23 DIAGNOSIS — Z3403 Encounter for supervision of normal first pregnancy, third trimester: Secondary | ICD-10-CM

## 2013-09-23 DIAGNOSIS — Z3400 Encounter for supervision of normal first pregnancy, unspecified trimester: Secondary | ICD-10-CM

## 2013-09-23 DIAGNOSIS — O365931 Maternal care for other known or suspected poor fetal growth, third trimester, fetus 1: Secondary | ICD-10-CM

## 2013-09-23 DIAGNOSIS — O365990 Maternal care for other known or suspected poor fetal growth, unspecified trimester, not applicable or unspecified: Secondary | ICD-10-CM

## 2013-09-23 LAB — POCT URINALYSIS DIPSTICK
Bilirubin, UA: NEGATIVE
Leukocytes, UA: NEGATIVE
Nitrite, UA: NEGATIVE
pH, UA: 8

## 2013-09-23 NOTE — Progress Notes (Signed)
HR - 125 Pt in office for routine OB visit, denies concerns at this time

## 2013-09-24 ENCOUNTER — Encounter: Payer: Self-pay | Admitting: Obstetrics & Gynecology

## 2013-09-24 LAB — US OB DETAIL + 14 WK

## 2013-09-24 NOTE — Patient Instructions (Signed)
Third Trimester of Pregnancy  The third trimester is from week 29 through week 42, months 7 through 9. The third trimester is a time when the fetus is growing rapidly. At the end of the ninth month, the fetus is about 20 inches in length and weighs 6 10 pounds.   BODY CHANGES  Your body goes through many changes during pregnancy. The changes vary from woman to woman.    Your weight will continue to increase. You can expect to gain 25 35 pounds (11 16 kg) by the end of the pregnancy.   You may begin to get stretch marks on your hips, abdomen, and breasts.   You may urinate more often because the fetus is moving lower into your pelvis and pressing on your bladder.   You may develop or continue to have heartburn as a result of your pregnancy.   You may develop constipation because certain hormones are causing the muscles that push waste through your intestines to slow down.   You may develop hemorrhoids or swollen, bulging veins (varicose veins).   You may have pelvic pain because of the weight gain and pregnancy hormones relaxing your joints between the bones in your pelvis. Back aches may result from over exertion of the muscles supporting your posture.   Your breasts will continue to grow and be tender. A yellow discharge may leak from your breasts called colostrum.   Your belly button may stick out.   You may feel short of breath because of your expanding uterus.   You may notice the fetus "dropping," or moving lower in your abdomen.   You may have a bloody mucus discharge. This usually occurs a few days to a week before labor begins.   Your cervix becomes thin and soft (effaced) near your due date.  WHAT TO EXPECT AT YOUR PRENATAL EXAMS   You will have prenatal exams every 2 weeks until week 36. Then, you will have weekly prenatal exams. During a routine prenatal visit:   You will be weighed to make sure you and the fetus are growing normally.   Your blood pressure is taken.   Your abdomen will be  measured to track your baby's growth.   The fetal heartbeat will be listened to.   Any test results from the previous visit will be discussed.   You may have a cervical check near your due date to see if you have effaced.  At around 36 weeks, your caregiver will check your cervix. At the same time, your caregiver will also perform a test on the secretions of the vaginal tissue. This test is to determine if a type of bacteria, Group B streptococcus, is present. Your caregiver will explain this further.  Your caregiver may ask you:   What your birth plan is.   How you are feeling.   If you are feeling the baby move.   If you have had any abnormal symptoms, such as leaking fluid, bleeding, severe headaches, or abdominal cramping.   If you have any questions.  Other tests or screenings that may be performed during your third trimester include:   Blood tests that check for low iron levels (anemia).   Fetal testing to check the health, activity level, and growth of the fetus. Testing is done if you have certain medical conditions or if there are problems during the pregnancy.  FALSE LABOR  You may feel small, irregular contractions that eventually go away. These are called Braxton Hicks contractions, or   false labor. Contractions may last for hours, days, or even weeks before true labor sets in. If contractions come at regular intervals, intensify, or become painful, it is best to be seen by your caregiver.   SIGNS OF LABOR    Menstrual-like cramps.   Contractions that are 5 minutes apart or less.   Contractions that start on the top of the uterus and spread down to the lower abdomen and back.   A sense of increased pelvic pressure or back pain.   A watery or bloody mucus discharge that comes from the vagina.  If you have any of these signs before the 37th week of pregnancy, call your caregiver right away. You need to go to the hospital to get checked immediately.  HOME CARE INSTRUCTIONS    Avoid all  smoking, herbs, alcohol, and unprescribed drugs. These chemicals affect the formation and growth of the baby.   Follow your caregiver's instructions regarding medicine use. There are medicines that are either safe or unsafe to take during pregnancy.   Exercise only as directed by your caregiver. Experiencing uterine cramps is a good sign to stop exercising.   Continue to eat regular, healthy meals.   Wear a good support bra for breast tenderness.   Do not use hot tubs, steam rooms, or saunas.   Wear your seat belt at all times when driving.   Avoid raw meat, uncooked cheese, cat litter boxes, and soil used by cats. These carry germs that can cause birth defects in the baby.   Take your prenatal vitamins.   Try taking a stool softener (if your caregiver approves) if you develop constipation. Eat more high-fiber foods, such as fresh vegetables or fruit and whole grains. Drink plenty of fluids to keep your urine clear or pale yellow.   Take warm sitz baths to soothe any pain or discomfort caused by hemorrhoids. Use hemorrhoid cream if your caregiver approves.   If you develop varicose veins, wear support hose. Elevate your feet for 15 minutes, 3 4 times a day. Limit salt in your diet.   Avoid heavy lifting, wear low heal shoes, and practice good posture.   Rest a lot with your legs elevated if you have leg cramps or low back pain.   Visit your dentist if you have not gone during your pregnancy. Use a soft toothbrush to brush your teeth and be gentle when you floss.   A sexual relationship may be continued unless your caregiver directs you otherwise.   Do not travel far distances unless it is absolutely necessary and only with the approval of your caregiver.   Take prenatal classes to understand, practice, and ask questions about the labor and delivery.   Make a trial run to the hospital.   Pack your hospital bag.   Prepare the baby's nursery.   Continue to go to all your prenatal visits as directed  by your caregiver.  SEEK MEDICAL CARE IF:   You are unsure if you are in labor or if your water has broken.   You have dizziness.   You have mild pelvic cramps, pelvic pressure, or nagging pain in your abdominal area.   You have persistent nausea, vomiting, or diarrhea.   You have a bad smelling vaginal discharge.   You have pain with urination.  SEEK IMMEDIATE MEDICAL CARE IF:    You have a fever.   You are leaking fluid from your vagina.   You have spotting or bleeding from your vagina.     You have severe abdominal cramping or pain.   You have rapid weight loss or gain.   You have shortness of breath with chest pain.   You notice sudden or extreme swelling of your face, hands, ankles, feet, or legs.   You have not felt your baby move in over an hour.   You have severe headaches that do not go away with medicine.   You have vision changes.  Document Released: 10/30/2001 Document Revised: 07/08/2013 Document Reviewed: 01/06/2013  [employer] Patient Information 2014 [employer].

## 2013-09-30 ENCOUNTER — Other Ambulatory Visit: Payer: Medicaid Other

## 2013-10-08 ENCOUNTER — Ambulatory Visit (INDEPENDENT_AMBULATORY_CARE_PROVIDER_SITE_OTHER): Payer: Medicaid Other | Admitting: Advanced Practice Midwife

## 2013-10-08 ENCOUNTER — Encounter: Payer: Self-pay | Admitting: Advanced Practice Midwife

## 2013-10-08 VITALS — BP 122/74 | Temp 97.6°F | Wt 178.0 lb

## 2013-10-08 DIAGNOSIS — Z3403 Encounter for supervision of normal first pregnancy, third trimester: Secondary | ICD-10-CM

## 2013-10-08 DIAGNOSIS — Z3400 Encounter for supervision of normal first pregnancy, unspecified trimester: Secondary | ICD-10-CM

## 2013-10-08 LAB — POCT URINALYSIS DIPSTICK
Blood, UA: NEGATIVE
Glucose, UA: NEGATIVE
Nitrite, UA: NEGATIVE
Protein, UA: NEGATIVE
Spec Grav, UA: 1.015
Urobilinogen, UA: NEGATIVE
pH, UA: 8

## 2013-10-08 NOTE — Progress Notes (Signed)
Subjective: Doris Lowe is a 17 y.o. at 33 weeks by early ultrasound  Patient denies vaginal leaking of fluid or bleeding, denies contractions.  Reports positive fetal movment.  Denies concerns today.  Objective: Filed Vitals:   10/08/13 1414  BP: 122/74  Temp: 97.6 F (36.4 C)   150 FHR 33 Fundal Height Fetal Position cephalic  Assessment: Patient Active Problem List   Diagnosis Date Noted  . Abnormal quad screen 06/16/2013  . Asymptomatic bacteriuria in pregnancy 05/15/2013  . Chlamydia infection, current pregnancy 05/15/2013  . Supervision of normal first pregnancy 05/11/2013    Plan: Patient to return to clinic in 2 weeks Reviewed BRF, encouraged class Discuss BCM NV Reviewed warning signs in pregnancy. Patient to call with concerns PRN. Reviewed triage location.  20 min spent with patient greater than 80% spent in counseling and coordination of care.   Terisha Losasso Wilson Singer CNM

## 2013-10-08 NOTE — Progress Notes (Signed)
Pulse 110  Pt states that everything is going well with pregnancy. Pt has no complaints of pain/pressure but states that she has had some irregular cramping.

## 2013-10-22 ENCOUNTER — Ambulatory Visit (INDEPENDENT_AMBULATORY_CARE_PROVIDER_SITE_OTHER): Payer: Medicaid Other | Admitting: Obstetrics & Gynecology

## 2013-10-22 ENCOUNTER — Encounter: Payer: Medicaid Other | Admitting: Obstetrics & Gynecology

## 2013-10-22 VITALS — BP 144/92 | Temp 98.6°F | Wt 185.0 lb

## 2013-10-22 DIAGNOSIS — O139 Gestational [pregnancy-induced] hypertension without significant proteinuria, unspecified trimester: Secondary | ICD-10-CM

## 2013-10-22 DIAGNOSIS — Z3403 Encounter for supervision of normal first pregnancy, third trimester: Secondary | ICD-10-CM

## 2013-10-22 DIAGNOSIS — Z113 Encounter for screening for infections with a predominantly sexual mode of transmission: Secondary | ICD-10-CM

## 2013-10-22 DIAGNOSIS — Z3400 Encounter for supervision of normal first pregnancy, unspecified trimester: Secondary | ICD-10-CM

## 2013-10-22 DIAGNOSIS — O133 Gestational [pregnancy-induced] hypertension without significant proteinuria, third trimester: Secondary | ICD-10-CM

## 2013-10-22 LAB — POCT URINALYSIS DIPSTICK
Bilirubin, UA: NEGATIVE
Spec Grav, UA: 1.015
pH, UA: 6

## 2013-10-22 LAB — CBC
MCH: 28.5 pg (ref 26.0–34.0)
MCHC: 32.8 g/dL (ref 30.0–36.0)
Platelets: 258 10*3/uL (ref 150–400)
RBC: 3.44 MIL/uL — ABNORMAL LOW (ref 3.87–5.11)
RDW: 15.3 % (ref 11.5–15.5)

## 2013-10-22 LAB — OB RESULTS CONSOLE GC/CHLAMYDIA
Chlamydia: NEGATIVE
Gonorrhea: NEGATIVE

## 2013-10-22 LAB — CREATININE, SERUM: Creat: 0.47 mg/dL — ABNORMAL LOW (ref 0.50–1.10)

## 2013-10-22 LAB — AST: AST: 21 U/L (ref 0–37)

## 2013-10-22 LAB — LACTATE DEHYDROGENASE: LDH: 240 U/L (ref 94–250)

## 2013-10-22 LAB — ALT: ALT: 15 U/L (ref 0–35)

## 2013-10-22 NOTE — Patient Instructions (Signed)
  Place 32-42 weeks prenatal visit patient instructions here.  

## 2013-10-22 NOTE — Progress Notes (Signed)
Pulse- 124 Pt states she is having lower abdomen pressure.

## 2013-10-22 NOTE — Addendum Note (Signed)
Addended by: Glendell Docker on: 10/22/2013 04:50 PM   Modules accepted: Orders

## 2013-10-23 LAB — GC/CHLAMYDIA PROBE AMP: GC Probe RNA: NEGATIVE

## 2013-10-26 LAB — STREP B DNA PROBE: GBSP: NEGATIVE

## 2013-10-27 ENCOUNTER — Other Ambulatory Visit: Payer: Self-pay | Admitting: Obstetrics & Gynecology

## 2013-10-27 ENCOUNTER — Ambulatory Visit (INDEPENDENT_AMBULATORY_CARE_PROVIDER_SITE_OTHER): Payer: Medicaid Other | Admitting: Obstetrics

## 2013-10-27 ENCOUNTER — Ambulatory Visit (INDEPENDENT_AMBULATORY_CARE_PROVIDER_SITE_OTHER): Payer: Medicaid Other

## 2013-10-27 VITALS — BP 142/88 | Temp 97.7°F | Wt 186.0 lb

## 2013-10-27 DIAGNOSIS — O365990 Maternal care for other known or suspected poor fetal growth, unspecified trimester, not applicable or unspecified: Secondary | ICD-10-CM

## 2013-10-27 DIAGNOSIS — O133 Gestational [pregnancy-induced] hypertension without significant proteinuria, third trimester: Secondary | ICD-10-CM

## 2013-10-27 DIAGNOSIS — O365931 Maternal care for other known or suspected poor fetal growth, third trimester, fetus 1: Secondary | ICD-10-CM

## 2013-10-27 DIAGNOSIS — O139 Gestational [pregnancy-induced] hypertension without significant proteinuria, unspecified trimester: Secondary | ICD-10-CM

## 2013-10-27 DIAGNOSIS — Z3480 Encounter for supervision of other normal pregnancy, unspecified trimester: Secondary | ICD-10-CM

## 2013-10-27 DIAGNOSIS — Z3483 Encounter for supervision of other normal pregnancy, third trimester: Secondary | ICD-10-CM

## 2013-10-27 LAB — POCT URINALYSIS DIPSTICK
Bilirubin, UA: NEGATIVE
Blood, UA: NEGATIVE
Glucose, UA: NEGATIVE
Nitrite, UA: NEGATIVE
Spec Grav, UA: 1.015
pH, UA: 6

## 2013-10-27 NOTE — Progress Notes (Signed)
Pulse- 116  Pt states she is having lower abdomen pain and lower back pain.

## 2013-10-28 ENCOUNTER — Encounter: Payer: Self-pay | Admitting: Obstetrics & Gynecology

## 2013-10-28 ENCOUNTER — Encounter: Payer: Self-pay | Admitting: Obstetrics

## 2013-10-28 LAB — US OB DETAIL + 14 WK

## 2013-10-30 ENCOUNTER — Encounter: Payer: Medicaid Other | Admitting: Advanced Practice Midwife

## 2013-11-02 ENCOUNTER — Ambulatory Visit (INDEPENDENT_AMBULATORY_CARE_PROVIDER_SITE_OTHER): Payer: Medicaid Other | Admitting: Obstetrics & Gynecology

## 2013-11-02 ENCOUNTER — Encounter: Payer: Medicaid Other | Admitting: Obstetrics & Gynecology

## 2013-11-02 VITALS — BP 140/80 | Wt 190.0 lb

## 2013-11-02 DIAGNOSIS — O133 Gestational [pregnancy-induced] hypertension without significant proteinuria, third trimester: Secondary | ICD-10-CM

## 2013-11-02 DIAGNOSIS — Z3403 Encounter for supervision of normal first pregnancy, third trimester: Secondary | ICD-10-CM

## 2013-11-02 DIAGNOSIS — O139 Gestational [pregnancy-induced] hypertension without significant proteinuria, unspecified trimester: Secondary | ICD-10-CM

## 2013-11-02 DIAGNOSIS — Z3400 Encounter for supervision of normal first pregnancy, unspecified trimester: Secondary | ICD-10-CM

## 2013-11-02 LAB — POCT URINALYSIS DIPSTICK
Bilirubin, UA: NEGATIVE
Blood, UA: NEGATIVE
Glucose, UA: NEGATIVE
Leukocytes, UA: NEGATIVE
Nitrite, UA: NEGATIVE
Spec Grav, UA: 1.015
Urobilinogen, UA: NEGATIVE
pH, UA: 6

## 2013-11-02 LAB — CBC
HCT: 30.6 % — ABNORMAL LOW (ref 36.0–46.0)
Hemoglobin: 10 g/dL — ABNORMAL LOW (ref 12.0–15.0)
MCH: 28.5 pg (ref 26.0–34.0)
MCV: 87.2 fL (ref 78.0–100.0)
Platelets: 246 10*3/uL (ref 150–400)
RDW: 15.6 % — ABNORMAL HIGH (ref 11.5–15.5)
WBC: 14.5 10*3/uL — ABNORMAL HIGH (ref 4.0–10.5)

## 2013-11-02 LAB — AST: AST: 21 U/L (ref 0–37)

## 2013-11-02 NOTE — Progress Notes (Signed)
Pulse 121 Pt states that she is doing well.  IOL this week.

## 2013-11-03 ENCOUNTER — Encounter (HOSPITAL_COMMUNITY): Payer: Self-pay | Admitting: *Deleted

## 2013-11-03 ENCOUNTER — Other Ambulatory Visit: Payer: Self-pay | Admitting: *Deleted

## 2013-11-03 ENCOUNTER — Telehealth (HOSPITAL_COMMUNITY): Payer: Self-pay | Admitting: *Deleted

## 2013-11-03 LAB — PROTEIN / CREATININE RATIO, URINE
Protein Creatinine Ratio: 0.08 (ref <0.15)
Total Protein, Urine: 18 mg/dL

## 2013-11-03 LAB — PATHOLOGIST SMEAR REVIEW

## 2013-11-03 NOTE — Patient Instructions (Signed)
Labor Induction  Labor induction is when steps are taken to cause a pregnant woman to begin the labor process. Most women go into labor on their own between 37 weeks and 42 weeks of the pregnancy. When this does not happen or when there is a medical need, methods may be used to induce labor. Labor induction causes a pregnant woman's uterus to contract. It also causes the cervix to soften (ripen), open (dilate), and thin out (efface). Usually, labor is not induced before 39 weeks of the pregnancy unless there is a problem with the baby or mother.  Before inducing labor, your health care provider will consider a number of factors, including the following:  The medical condition of you and the baby.   How many weeks along you are.   The status of the baby's lung maturity.   The condition of the cervix.   The position of the baby.  WHAT ARE THE REASONS FOR LABOR INDUCTION? Labor may be induced for the following reasons:  The health of the baby or mother is at risk.   The pregnancy is overdue by 1 week or more.   The water breaks but labor does not start on its own.   The mother has a health condition or serious illness, such as high blood pressure, infection, placental abruption, or diabetes.  The amniotic fluid amounts are low around the baby.   The baby is distressed.  Convenience or wanting the baby to be born on a certain date is not a reason for inducing labor. WHAT METHODS ARE USED FOR LABOR INDUCTION? Several methods of labor induction may be used, such as:   Prostaglandin medicine. This medicine causes the cervix to dilate and ripen. The medicine will also start contractions. It can be taken by mouth or by inserting a suppository into the vagina.   Inserting a thin tube (catheter) with a balloon on the end into the vagina to dilate the cervix. Once inserted, the balloon is expanded with water, which causes the cervix to open.   Stripping the membranes. Your health  care provider separates amniotic sac tissue from the cervix, causing the cervix to be stretched and causing the release of a hormone called progesterone. This may cause the uterus to contract. It is often done during an office visit. You will be sent home to wait for the contractions to begin. You will then come in for an induction.   Breaking the water. Your health care provider makes a hole in the amniotic sac using a small instrument. Once the amniotic sac breaks, contractions should begin. This may still take hours to see an effect.   Medicine to trigger or strengthen contractions. This medicine is given through an IV access tube inserted into a vein in your arm.  All of the methods of induction, besides stripping the membranes, will be done in the hospital. Induction is done in the hospital so that you and the baby can be carefully monitored.  HOW LONG DOES IT TAKE FOR LABOR TO BE INDUCED? Some inductions can take up to 2 3 days. Depending on the cervix, it usually takes less time. It takes longer when you are induced early in the pregnancy or if this is your first pregnancy. If a mother is still pregnant and the induction has been going on for 2 3 days, either the mother will be sent home or a cesarean delivery will be needed. WHAT ARE THE RISKS ASSOCIATED WITH LABOR INDUCTION? Some of the risks   of induction include:   Changes in fetal heart rate, such as too high, too low, or erratic.   Fetal distress.   Chance of infection for the mother and baby.   Increased chance of having a cesarean delivery.   Breaking off (abruption) of the placenta from the uterus (rare).   Uterine rupture (very rare).  When induction is needed for medical reasons, the benefits of induction may outweigh the risks. WHAT ARE SOME REASONS FOR NOT INDUCING LABOR? Labor induction should not be done if:   It is shown that your baby does not tolerate labor.   You have had previous surgeries on your  uterus, such as a myomectomy or the removal of fibroids.   Your placenta lies very low in the uterus and blocks the opening of the cervix (placenta previa).   Your baby is not in a head-down position.   The umbilical cord drops down into the birth canal in front of the baby. This could cut off the baby's blood and oxygen supply.   You have had a previous cesarean delivery.   There are unusual circumstances, such as the baby being extremely premature.  Document Released: 03/27/2007 Document Revised: 07/08/2013 Document Reviewed: 06/04/2013 [employer] Patient Information 2014 [employer].  

## 2013-11-03 NOTE — Telephone Encounter (Signed)
Preadmission screen  

## 2013-11-04 ENCOUNTER — Encounter: Payer: Self-pay | Admitting: Obstetrics & Gynecology

## 2013-11-05 ENCOUNTER — Ambulatory Visit (INDEPENDENT_AMBULATORY_CARE_PROVIDER_SITE_OTHER): Payer: Medicaid Other | Admitting: Obstetrics & Gynecology

## 2013-11-05 VITALS — BP 140/86 | Temp 99.1°F | Wt 192.0 lb

## 2013-11-05 DIAGNOSIS — O139 Gestational [pregnancy-induced] hypertension without significant proteinuria, unspecified trimester: Secondary | ICD-10-CM

## 2013-11-05 DIAGNOSIS — O133 Gestational [pregnancy-induced] hypertension without significant proteinuria, third trimester: Secondary | ICD-10-CM

## 2013-11-05 DIAGNOSIS — Z3483 Encounter for supervision of other normal pregnancy, third trimester: Secondary | ICD-10-CM

## 2013-11-05 DIAGNOSIS — Z3480 Encounter for supervision of other normal pregnancy, unspecified trimester: Secondary | ICD-10-CM

## 2013-11-05 LAB — POCT URINALYSIS DIPSTICK
Glucose, UA: NEGATIVE
Ketones, UA: NEGATIVE
Leukocytes, UA: NEGATIVE
Spec Grav, UA: 1.01
Urobilinogen, UA: NEGATIVE
pH, UA: 7

## 2013-11-05 NOTE — Progress Notes (Signed)
Pulse- 107 Denies SOB, epigastric pain, neurological symptoms.

## 2013-11-06 ENCOUNTER — Encounter (HOSPITAL_COMMUNITY): Payer: Self-pay

## 2013-11-06 ENCOUNTER — Inpatient Hospital Stay (HOSPITAL_COMMUNITY)
Admission: RE | Admit: 2013-11-06 | Discharge: 2013-11-11 | DRG: 765 | Disposition: A | Discharge status: Home / Self Care | Payer: Medicaid Other | Source: Ambulatory Visit | Attending: Obstetrics | Admitting: Obstetrics

## 2013-11-06 DIAGNOSIS — O133 Gestational [pregnancy-induced] hypertension without significant proteinuria, third trimester: Secondary | ICD-10-CM

## 2013-11-06 DIAGNOSIS — O139 Gestational [pregnancy-induced] hypertension without significant proteinuria, unspecified trimester: Secondary | ICD-10-CM | POA: Diagnosis present

## 2013-11-06 DIAGNOSIS — O280 Abnormal hematological finding on antenatal screening of mother: Secondary | ICD-10-CM

## 2013-11-06 DIAGNOSIS — O9902 Anemia complicating childbirth: Secondary | ICD-10-CM | POA: Diagnosis present

## 2013-11-06 DIAGNOSIS — D649 Anemia, unspecified: Secondary | ICD-10-CM | POA: Diagnosis present

## 2013-11-06 DIAGNOSIS — A749 Chlamydial infection, unspecified: Secondary | ICD-10-CM

## 2013-11-06 DIAGNOSIS — IMO0002 Reserved for concepts with insufficient information to code with codable children: Secondary | ICD-10-CM | POA: Diagnosis present

## 2013-11-06 DIAGNOSIS — Z3402 Encounter for supervision of normal first pregnancy, second trimester: Secondary | ICD-10-CM

## 2013-11-06 DIAGNOSIS — N390 Urinary tract infection, site not specified: Secondary | ICD-10-CM

## 2013-11-06 HISTORY — DX: Essential (primary) hypertension: I10

## 2013-11-06 HISTORY — DX: Bacteriuria: R82.71

## 2013-11-06 HISTORY — DX: Chlamydial infection, unspecified: A74.9

## 2013-11-06 LAB — CBC
Hemoglobin: 9.5 g/dL — ABNORMAL LOW (ref 12.0–15.0)
MCH: 27.9 pg (ref 26.0–34.0)
MCV: 85 fL (ref 78.0–100.0)
Platelets: 231 10*3/uL (ref 150–400)
RBC: 3.4 MIL/uL — ABNORMAL LOW (ref 3.87–5.11)
RDW: 14.6 % (ref 11.5–15.5)

## 2013-11-06 MED ORDER — LACTATED RINGERS IV SOLN
INTRAVENOUS | Status: DC
  Administered 2013-11-06 – 2013-11-08 (×5): via INTRAVENOUS

## 2013-11-06 MED ORDER — TERBUTALINE SULFATE 1 MG/ML IJ SOLN: 0.2500 mg | Freq: Once | INTRAMUSCULAR | Status: AC | PRN

## 2013-11-06 MED ORDER — IBUPROFEN 600 MG PO TABS: 600.0000 mg | ORAL_TABLET | Freq: Four times a day (QID) | ORAL | Status: DC | PRN

## 2013-11-06 MED ORDER — BUTORPHANOL TARTRATE 1 MG/ML IJ SOLN
1.0000 mg | INTRAMUSCULAR | Status: DC | PRN
  Administered 2013-11-08: 1 mg via INTRAVENOUS
  Filled 2013-11-06 (×2): qty 1

## 2013-11-06 MED ORDER — MISOPROSTOL 25 MCG QUARTER TABLET
25.0000 ug | ORAL_TABLET | ORAL | Status: DC | PRN
  Administered 2013-11-06 – 2013-11-08 (×6): 25 ug via VAGINAL
  Filled 2013-11-06 (×3): qty 0.25
  Filled 2013-11-06: qty 1
  Filled 2013-11-06 (×3): qty 0.25

## 2013-11-06 MED ORDER — OXYCODONE-ACETAMINOPHEN 5-325 MG PO TABS: 1.0000 | ORAL_TABLET | ORAL | Status: DC | PRN

## 2013-11-06 MED ORDER — OXYTOCIN 40 UNITS IN LACTATED RINGERS INFUSION - SIMPLE MED
62.5000 mL/h | INTRAVENOUS | Status: DC
  Filled 2013-11-06: qty 1000

## 2013-11-06 MED ORDER — LACTATED RINGERS IV SOLN: 500.0000 mL | INTRAVENOUS | Status: DC | PRN

## 2013-11-06 MED ORDER — OXYTOCIN BOLUS FROM INFUSION: 500.0000 mL | INTRAVENOUS | Status: DC

## 2013-11-06 MED ORDER — ACETAMINOPHEN 325 MG PO TABS: 650.0000 mg | ORAL_TABLET | ORAL | Status: DC | PRN

## 2013-11-06 MED ORDER — LIDOCAINE HCL (PF) 1 % IJ SOLN
30.0000 mL | INTRAMUSCULAR | Status: DC | PRN
  Filled 2013-11-06: qty 30

## 2013-11-06 MED ORDER — CITRIC ACID-SODIUM CITRATE 334-500 MG/5ML PO SOLN
30.0000 mL | ORAL | Status: DC | PRN
  Filled 2013-11-06: qty 15

## 2013-11-06 MED ORDER — HYDROXYZINE HCL 50 MG PO TABS
50.0000 mg | ORAL_TABLET | Freq: Four times a day (QID) | ORAL | Status: DC | PRN
  Filled 2013-11-06: qty 1

## 2013-11-06 MED ORDER — ONDANSETRON HCL 4 MG/2ML IJ SOLN: 4.0000 mg | Freq: Four times a day (QID) | INTRAMUSCULAR | Status: DC | PRN

## 2013-11-07 ENCOUNTER — Encounter (HOSPITAL_COMMUNITY): Payer: Self-pay

## 2013-11-07 LAB — ABO/RH: ABO/RH(D): B POS

## 2013-11-07 LAB — RPR: RPR Ser Ql: NONREACTIVE

## 2013-11-07 MED ORDER — SIMETHICONE 80 MG PO CHEW
80.0000 mg | CHEWABLE_TABLET | Freq: Four times a day (QID) | ORAL | Status: DC | PRN
Start: 2013-11-07 — End: 2013-11-08
  Administered 2013-11-07: 80 mg via ORAL
  Filled 2013-11-07: qty 1

## 2013-11-07 NOTE — H&P (Signed)
Doris Lowe is a 17 y.o. female presenting for IOL. Maternal Medical History:  Reason for admission: 17 yo G1.  EDC 11-26-13.  Presents for IOL for PIH.  Fetal activity: Perceived fetal activity is normal.   Last perceived fetal movement was within the past hour.    Prenatal complications: PIH.   Prenatal Complications - Diabetes: none.    OB History   Grav Para Term Preterm Abortions TAB SAB Ect Mult Living   1              Past Medical History  Diagnosis Date  . Hypertension   . Pregnancy induced hypertension   . Chlamydia   . Bacteriuria    Past Surgical History  Procedure Laterality Date  . No past surgeries     Family History: family history includes Cancer in her maternal grandfather and maternal grandmother; Dementia in her paternal grandmother; Hypertension in her paternal grandfather; Stroke in her father. Social History:  reports that she has never smoked. She does not have any smokeless tobacco history on file. She reports that she does not drink alcohol or use illicit drugs.   Prenatal Transfer Tool  Maternal Diabetes: No Genetic Screening: Abnormal:  Results: Elevated risk of Trisomy 21 Maternal Ultrasounds/Referrals: Normal Fetal Ultrasounds or other Referrals:  Referred to Materal Fetal Medicine  Maternal Substance Abuse:  No Significant Maternal Medications:  None Significant Maternal Lab Results:  None Other Comments:  None  Review of Systems  All other systems reviewed and are negative.    Dilation: 1 Effacement (%): Thick Station: -2 Exam by::  (L. Mcdaniel Rn) Blood pressure 142/93, pulse 116, temperature 98.3 F (36.8 C), temperature source Oral, resp. rate 16, height 5' (1.524 m), weight 192 lb (87.091 kg), last menstrual period 02/07/2013, SpO2 83.00%. Maternal Exam:  Abdomen: Patient reports no abdominal tenderness. Fetal presentation: vertex     Physical Exam  Constitutional: She is oriented to person, place, and time. She appears  well-developed and well-nourished.  HENT:  Head: Normocephalic and atraumatic.  Eyes: Conjunctivae are normal. Pupils are equal, round, and reactive to light.  Neck: Normal range of motion. Neck supple.  Cardiovascular: Normal rate and regular rhythm.   Respiratory: Effort normal.  GI: Soft.  Musculoskeletal: Normal range of motion.  Neurological: She is alert and oriented to person, place, and time.  Skin: Skin is warm and dry.  Psychiatric: She has a normal mood and affect. Her behavior is normal. Judgment and thought content normal.    Prenatal labs: ABO, Rh: --/--/B POS, B POS (12/19 2045) Antibody: NEG (12/19 2045) Rubella: 3.21 (06/23 1558) RPR: NON REACTIVE (12/19 2045)  HBsAg: NEGATIVE (06/23 1558)  HIV: NON REACTIVE (09/25 0932)  GBS: NEGATIVE (12/04 1628)   Assessment/Plan: 37.2 weeks.  PIH.  2 stage IOL.   HARPER,CHARLES A 11/07/2013, 11:09 AM

## 2013-11-08 ENCOUNTER — Encounter (HOSPITAL_COMMUNITY): Payer: Self-pay

## 2013-11-08 ENCOUNTER — Encounter (HOSPITAL_COMMUNITY): Payer: Medicaid Other | Admitting: Anesthesiology

## 2013-11-08 ENCOUNTER — Inpatient Hospital Stay (HOSPITAL_COMMUNITY): Payer: Medicaid Other | Admitting: Anesthesiology

## 2013-11-08 ENCOUNTER — Encounter (HOSPITAL_COMMUNITY)
Admission: RE | Disposition: A | Discharge status: Home / Self Care | Payer: Self-pay | Source: Ambulatory Visit | Attending: Obstetrics

## 2013-11-08 SURGERY — Surgical Case
Anesthesia: General | Site: Abdomen

## 2013-11-08 MED ORDER — LACTATED RINGERS IV SOLN
INTRAVENOUS | Status: DC
  Administered 2013-11-09: 1 mL via INTRAVENOUS

## 2013-11-08 MED ORDER — PHENYLEPHRINE 40 MCG/ML (10ML) SYRINGE FOR IV PUSH (FOR BLOOD PRESSURE SUPPORT)
PREFILLED_SYRINGE | INTRAVENOUS | Status: AC
  Filled 2013-11-08: qty 5

## 2013-11-08 MED ORDER — MEPERIDINE HCL 25 MG/ML IJ SOLN
INTRAMUSCULAR | Status: AC
  Filled 2013-11-08: qty 1

## 2013-11-08 MED ORDER — SIMETHICONE 80 MG PO CHEW
80.0000 mg | CHEWABLE_TABLET | Freq: Three times a day (TID) | ORAL | Status: DC
  Administered 2013-11-09 – 2013-11-11 (×7): 80 mg via ORAL
  Filled 2013-11-08 (×7): qty 1

## 2013-11-08 MED ORDER — PHENYLEPHRINE HCL 10 MG/ML IJ SOLN: INTRAMUSCULAR | Status: DC | PRN

## 2013-11-08 MED ORDER — KETOROLAC TROMETHAMINE 30 MG/ML IJ SOLN
INTRAMUSCULAR | Status: AC
  Filled 2013-11-08: qty 1

## 2013-11-08 MED ORDER — FENTANYL CITRATE 0.05 MG/ML IJ SOLN
INTRAMUSCULAR | Status: DC | PRN
  Administered 2013-11-08 (×2): 50 ug via INTRAVENOUS
  Administered 2013-11-08: 100 ug via INTRAVENOUS
  Administered 2013-11-08 (×2): 50 ug via INTRAVENOUS
  Administered 2013-11-08: 150 ug via INTRAVENOUS
  Administered 2013-11-08: 50 ug via INTRAVENOUS

## 2013-11-08 MED ORDER — OXYTOCIN 40 UNITS IN LACTATED RINGERS INFUSION - SIMPLE MED
1.0000 m[IU]/min | INTRAVENOUS | Status: DC
  Administered 2013-11-08: 1 m[IU]/min via INTRAVENOUS

## 2013-11-08 MED ORDER — FENTANYL 2.5 MCG/ML BUPIVACAINE 1/10 % EPIDURAL INFUSION (WH - ANES): 14.0000 mL/h | INTRAMUSCULAR | Status: DC | PRN

## 2013-11-08 MED ORDER — SIMETHICONE 80 MG PO CHEW
80.0000 mg | CHEWABLE_TABLET | ORAL | Status: DC
  Administered 2013-11-09 – 2013-11-11 (×3): 80 mg via ORAL
  Filled 2013-11-08 (×3): qty 1

## 2013-11-08 MED ORDER — PHENYLEPHRINE 40 MCG/ML (10ML) SYRINGE FOR IV PUSH (FOR BLOOD PRESSURE SUPPORT): 80.0000 ug | PREFILLED_SYRINGE | INTRAVENOUS | Status: DC | PRN

## 2013-11-08 MED ORDER — ONDANSETRON HCL 4 MG/2ML IJ SOLN
INTRAMUSCULAR | Status: DC | PRN
  Administered 2013-11-08: 4 mg via INTRAVENOUS

## 2013-11-08 MED ORDER — NALOXONE HCL 0.4 MG/ML IJ SOLN: 0.4000 mg | INTRAMUSCULAR | Status: DC | PRN

## 2013-11-08 MED ORDER — EPHEDRINE 5 MG/ML INJ: 10.0000 mg | INTRAVENOUS | Status: DC | PRN

## 2013-11-08 MED ORDER — DIBUCAINE 1 % RE OINT: 1.0000 "application " | TOPICAL_OINTMENT | RECTAL | Status: DC | PRN

## 2013-11-08 MED ORDER — MIDAZOLAM HCL 2 MG/2ML IJ SOLN
INTRAMUSCULAR | Status: DC | PRN
  Administered 2013-11-08: 2 mg via INTRAVENOUS

## 2013-11-08 MED ORDER — PROMETHAZINE HCL 25 MG/ML IJ SOLN: 6.2500 mg | INTRAMUSCULAR | Status: DC | PRN

## 2013-11-08 MED ORDER — LABETALOL HCL 200 MG PO TABS
200.0000 mg | ORAL_TABLET | Freq: Three times a day (TID) | ORAL | Status: DC
  Administered 2013-11-09 – 2013-11-11 (×7): 200 mg via ORAL
  Filled 2013-11-08 (×11): qty 1

## 2013-11-08 MED ORDER — IBUPROFEN 600 MG PO TABS
600.0000 mg | ORAL_TABLET | Freq: Four times a day (QID) | ORAL | Status: DC
  Administered 2013-11-09 – 2013-11-11 (×10): 600 mg via ORAL
  Filled 2013-11-08 (×10): qty 1

## 2013-11-08 MED ORDER — ONDANSETRON HCL 4 MG/2ML IJ SOLN: 4.0000 mg | Freq: Four times a day (QID) | INTRAMUSCULAR | Status: DC | PRN

## 2013-11-08 MED ORDER — LACTATED RINGERS IV SOLN
500.0000 mL | Freq: Once | INTRAVENOUS | Status: DC
Start: 2013-11-08 — End: 2013-11-08

## 2013-11-08 MED ORDER — OXYTOCIN 40 UNITS IN LACTATED RINGERS INFUSION - SIMPLE MED: 62.5000 mL/h | INTRAVENOUS | Status: DC

## 2013-11-08 MED ORDER — ZOLPIDEM TARTRATE 5 MG PO TABS: 5.0000 mg | ORAL_TABLET | Freq: Every evening | ORAL | Status: DC | PRN

## 2013-11-08 MED ORDER — MEPERIDINE HCL 25 MG/ML IJ SOLN
6.2500 mg | INTRAMUSCULAR | Status: DC | PRN
  Administered 2013-11-08: 6.25 mg via INTRAVENOUS

## 2013-11-08 MED ORDER — PROPOFOL 10 MG/ML IV EMUL
INTRAVENOUS | Status: AC
  Filled 2013-11-08: qty 20

## 2013-11-08 MED ORDER — HYDROMORPHONE 0.3 MG/ML IV SOLN
INTRAVENOUS | Status: DC
  Administered 2013-11-08: 22:00:00 via INTRAVENOUS
  Administered 2013-11-09: 0.9 mg via INTRAVENOUS
  Administered 2013-11-09: 1.8 mg via INTRAVENOUS
  Administered 2013-11-09: 3 mL via INTRAVENOUS
  Administered 2013-11-09: 6 mL via INTRAVENOUS
  Filled 2013-11-08: qty 25

## 2013-11-08 MED ORDER — CEFAZOLIN SODIUM-DEXTROSE 2-3 GM-% IV SOLR
INTRAVENOUS | Status: DC | PRN
  Administered 2013-11-08: 2 g via INTRAVENOUS

## 2013-11-08 MED ORDER — SUCCINYLCHOLINE CHLORIDE 20 MG/ML IJ SOLN
INTRAMUSCULAR | Status: AC
  Filled 2013-11-08: qty 10

## 2013-11-08 MED ORDER — WITCH HAZEL-GLYCERIN EX PADS: 1.0000 "application " | MEDICATED_PAD | CUTANEOUS | Status: DC | PRN

## 2013-11-08 MED ORDER — DIPHENHYDRAMINE HCL 50 MG/ML IJ SOLN: 12.5000 mg | Freq: Four times a day (QID) | INTRAMUSCULAR | Status: DC | PRN

## 2013-11-08 MED ORDER — MIDAZOLAM HCL 2 MG/2ML IJ SOLN
INTRAMUSCULAR | Status: AC
  Filled 2013-11-08: qty 2

## 2013-11-08 MED ORDER — OXYTOCIN 40 UNITS IN LACTATED RINGERS INFUSION - SIMPLE MED: 62.5000 mL/h | INTRAVENOUS | Status: AC

## 2013-11-08 MED ORDER — FENTANYL CITRATE 0.05 MG/ML IJ SOLN
INTRAMUSCULAR | Status: AC
  Filled 2013-11-08: qty 5

## 2013-11-08 MED ORDER — SENNOSIDES-DOCUSATE SODIUM 8.6-50 MG PO TABS
2.0000 | ORAL_TABLET | ORAL | Status: DC
  Administered 2013-11-09 – 2013-11-11 (×3): 2 via ORAL
  Filled 2013-11-08 (×3): qty 2

## 2013-11-08 MED ORDER — PROPOFOL 10 MG/ML IV BOLUS
INTRAVENOUS | Status: DC | PRN
  Administered 2013-11-08: 200 mg via INTRAVENOUS

## 2013-11-08 MED ORDER — METHYLERGONOVINE MALEATE 0.2 MG/ML IJ SOLN: 0.2000 mg | Freq: Four times a day (QID) | INTRAMUSCULAR | Status: DC

## 2013-11-08 MED ORDER — HYDROMORPHONE HCL PF 1 MG/ML IJ SOLN
0.2500 mg | INTRAMUSCULAR | Status: DC | PRN
  Administered 2013-11-08 (×2): 0.5 mg via INTRAVENOUS

## 2013-11-08 MED ORDER — HYDROMORPHONE HCL PF 1 MG/ML IJ SOLN
INTRAMUSCULAR | Status: AC
  Filled 2013-11-08: qty 1

## 2013-11-08 MED ORDER — TETANUS-DIPHTH-ACELL PERTUSSIS 5-2.5-18.5 LF-MCG/0.5 IM SUSP: 0.5000 mL | Freq: Once | INTRAMUSCULAR | Status: DC

## 2013-11-08 MED ORDER — DIPHENHYDRAMINE HCL 50 MG/ML IJ SOLN: 12.5000 mg | INTRAMUSCULAR | Status: DC | PRN

## 2013-11-08 MED ORDER — MENTHOL 3 MG MT LOZG: 1.0000 | LOZENGE | OROMUCOSAL | Status: DC | PRN

## 2013-11-08 MED ORDER — SUCCINYLCHOLINE CHLORIDE 20 MG/ML IJ SOLN
INTRAMUSCULAR | Status: DC | PRN
  Administered 2013-11-08: 120 mg via INTRAVENOUS

## 2013-11-08 MED ORDER — OXYTOCIN 10 UNIT/ML IJ SOLN
40.0000 [IU] | INTRAVENOUS | Status: DC | PRN
  Administered 2013-11-08: 40 [IU] via INTRAVENOUS

## 2013-11-08 MED ORDER — ONDANSETRON HCL 4 MG PO TABS: 4.0000 mg | ORAL_TABLET | ORAL | Status: DC | PRN

## 2013-11-08 MED ORDER — ONDANSETRON HCL 4 MG/2ML IJ SOLN: 4.0000 mg | INTRAMUSCULAR | Status: DC | PRN

## 2013-11-08 MED ORDER — PRENATAL MULTIVITAMIN CH
1.0000 | ORAL_TABLET | Freq: Every day | ORAL | Status: DC
  Administered 2013-11-09 – 2013-11-10 (×2): 1 via ORAL
  Filled 2013-11-08 (×2): qty 1

## 2013-11-08 MED ORDER — TERBUTALINE SULFATE 1 MG/ML IJ SOLN
0.2500 mg | Freq: Once | INTRAMUSCULAR | Status: AC | PRN
  Administered 2013-11-08: 0.25 mg via SUBCUTANEOUS
  Filled 2013-11-08: qty 1

## 2013-11-08 MED ORDER — PHENYLEPHRINE HCL 10 MG/ML IJ SOLN
INTRAMUSCULAR | Status: DC | PRN
  Administered 2013-11-08: 80 mg via INTRAVENOUS

## 2013-11-08 MED ORDER — SODIUM CHLORIDE 0.9 % IJ SOLN: 9.0000 mL | INTRAMUSCULAR | Status: DC | PRN

## 2013-11-08 MED ORDER — SIMETHICONE 80 MG PO CHEW: 80.0000 mg | CHEWABLE_TABLET | ORAL | Status: DC | PRN

## 2013-11-08 MED ORDER — METHYLERGONOVINE MALEATE 0.2 MG/ML IJ SOLN
INTRAMUSCULAR | Status: DC | PRN
  Administered 2013-11-08: 0.2 mg via INTRAMUSCULAR

## 2013-11-08 MED ORDER — DIPHENHYDRAMINE HCL 25 MG PO CAPS: 25.0000 mg | ORAL_CAPSULE | Freq: Four times a day (QID) | ORAL | Status: DC | PRN

## 2013-11-08 MED ORDER — LANOLIN HYDROUS EX OINT: 1.0000 "application " | TOPICAL_OINTMENT | CUTANEOUS | Status: DC | PRN

## 2013-11-08 MED ORDER — OXYTOCIN 10 UNIT/ML IJ SOLN
INTRAMUSCULAR | Status: AC
  Filled 2013-11-08: qty 4

## 2013-11-08 MED ORDER — ONDANSETRON HCL 4 MG/2ML IJ SOLN
INTRAMUSCULAR | Status: AC
  Filled 2013-11-08: qty 2

## 2013-11-08 MED ORDER — PHENYLEPHRINE HCL 10 MG/ML IJ SOLN
INTRAMUSCULAR | Status: DC | PRN
  Administered 2013-11-08: 80 ug via INTRAVENOUS
  Administered 2013-11-08 (×5): 40 ug via INTRAVENOUS

## 2013-11-08 MED ORDER — OXYCODONE-ACETAMINOPHEN 5-325 MG PO TABS
1.0000 | ORAL_TABLET | ORAL | Status: DC | PRN
  Administered 2013-11-09 – 2013-11-11 (×6): 1 via ORAL
  Filled 2013-11-08 (×6): qty 1

## 2013-11-08 MED ORDER — DIPHENHYDRAMINE HCL 12.5 MG/5ML PO ELIX
12.5000 mg | ORAL_SOLUTION | Freq: Four times a day (QID) | ORAL | Status: DC | PRN
  Filled 2013-11-08: qty 5

## 2013-11-08 MED ORDER — METHYLERGONOVINE MALEATE 0.2 MG PO TABS: 0.2000 mg | ORAL_TABLET | Freq: Four times a day (QID) | ORAL | Status: DC

## 2013-11-08 MED ORDER — KETOROLAC TROMETHAMINE 30 MG/ML IJ SOLN
15.0000 mg | Freq: Once | INTRAMUSCULAR | Status: AC | PRN
Start: 2013-11-08 — End: 2013-11-08
  Administered 2013-11-08: 30 mg via INTRAVENOUS

## 2013-11-08 SURGICAL SUPPLY — 26 items
BARRIER ADHS 3X4 INTERCEED (GAUZE/BANDAGES/DRESSINGS) IMPLANT
CLAMP CORD UMBIL (MISCELLANEOUS) IMPLANT
CLOTH BEACON ORANGE TIMEOUT ST (SAFETY) ×2 IMPLANT
DRAPE LG THREE QUARTER DISP (DRAPES) IMPLANT
DRSG OPSITE POSTOP 4X10 (GAUZE/BANDAGES/DRESSINGS) ×2 IMPLANT
DURAPREP 26ML APPLICATOR (WOUND CARE) ×2 IMPLANT
ELECT REM PT RETURN 9FT ADLT (ELECTROSURGICAL) ×2
ELECTRODE REM PT RTRN 9FT ADLT (ELECTROSURGICAL) ×1 IMPLANT
EXTRACTOR VACUUM KIWI (MISCELLANEOUS) IMPLANT
GLOVE BIO SURGEON STRL SZ 6.5 (GLOVE) ×4 IMPLANT
GLOVE BIOGEL PI IND STRL 7.0 (GLOVE) ×3 IMPLANT
GLOVE BIOGEL PI INDICATOR 7.0 (GLOVE) ×3
GOWN PREVENTION PLUS XLARGE (GOWN DISPOSABLE) ×4 IMPLANT
GOWN STRL REIN XL XLG (GOWN DISPOSABLE) ×4 IMPLANT
KIT ABG SYR 3ML LUER SLIP (SYRINGE) ×2 IMPLANT
NEEDLE HYPO 25X5/8 SAFETYGLIDE (NEEDLE) ×2 IMPLANT
NS IRRIG 1000ML POUR BTL (IV SOLUTION) ×2 IMPLANT
PACK C SECTION WH (CUSTOM PROCEDURE TRAY) ×2 IMPLANT
PAD OB MATERNITY 4.3X12.25 (PERSONAL CARE ITEMS) ×2 IMPLANT
SUT VIC AB 0 CT1 36 (SUTURE) ×8 IMPLANT
SUT VIC AB 2-0 CT1 27 (SUTURE) ×1
SUT VIC AB 2-0 CT1 TAPERPNT 27 (SUTURE) ×1 IMPLANT
SUT VIC AB 4-0 PS2 27 (SUTURE) ×2 IMPLANT
TOWEL OR 17X24 6PK STRL BLUE (TOWEL DISPOSABLE) ×2 IMPLANT
TRAY FOLEY CATH 14FR (SET/KITS/TRAYS/PACK) ×2 IMPLANT
WATER STERILE IRR 1000ML POUR (IV SOLUTION) ×2 IMPLANT

## 2013-11-08 NOTE — Progress Notes (Signed)
SVE preformed umbilical cord prolapsed through cervix felt in vagina--pressure applied to fetal head to prevent compression--private OB notified--in house physician notified--pt prepped for C-section--emergency explained to pt verbal consent received to proceed with emergency section with general anesthesia per MD recommendation

## 2013-11-08 NOTE — Progress Notes (Signed)
Pt off monitors for shower 

## 2013-11-08 NOTE — Lactation Note (Signed)
This note was copied from the chart of Doris Lowe. Lactation Consultation Note  Patient Name: Doris Lowe [id number] Date: 11/08/2013 Reason for consult: Initial assessment;Difficult latch.  LC assisted with first latch in PACU.  Mom has large breasts with flat nipples and firm base which is not compressible for latch so #24 NS used for achieving deep areolar grasp and strong/rhythmical sucking bursts and intermittent swallows for >25 minutes on (R) breast.  FOB at bedside and mom states she feels sleepy once baby started rhythmical sucks and intermittent swallows noted.  Temporary use and application and cleaning of NS reviewed with both parents.   LC encouraged review of Baby and Me pp 14 and 20-25 for STS and BF information. LC provided [employer] brochure and reviewed [facility] services and list of community and web site resources.   Maternal Data Formula Feeding for Exclusion: Yes Reason for exclusion: Mother's choice to formula and breast feed on admission Infant to breast within first hour of birth: No Breastfeeding delayed due to:: Maternal status (mom with C/S under general anesthesia) Has patient been taught Hand Expression?: Yes (no colostrum yet expressible; mom shown technique) Does the patient have breastfeeding experience prior to this delivery?: No  Feeding Feeding Type: Breast Fed Length of feed: 25 min  LATCH Score/Interventions Latch: Grasps breast easily, tongue down, lips flanged, rhythmical sucking. (unable to latch at first but latched well w/NS)  Audible Swallowing: Spontaneous and intermittent Intervention(s): Skin to skin  Type of Nipple: Flat Intervention(s):  (in PACU; baby cuing so NS used)  Comfort (Breast/Nipple): Soft / non-tender     Hold (Positioning): Assistance needed to correctly position infant at breast and maintain latch. Intervention(s): Breastfeeding basics reviewed;Position options;Skin to skin  LATCH Score: 8  Lactation Tools  Discussed/Used Tools: Nipple Shields Nipple shield size: 24 STS, hand expression, cue feedings  Consult Status Consult Status: Follow-up Date: 11/09/13 Follow-up type: In-patient    Warrick Parisian [facility] 11/08/2013, 8:25 PM

## 2013-11-08 NOTE — Progress Notes (Signed)
Doris Lowe is a 17 y.o. G1P1001 at [redacted]w[redacted]d by LMP admitted for rupture of membranes  Subjective:   Objective: BP 146/77  Pulse 130  Temp(Src) 98.2 F (36.8 C) (Oral)  Resp 18  Ht 5' (1.524 m)  Wt 192 lb (87.091 kg)  BMI 37.50 kg/m2  SpO2 83%  LMP 02/07/2013 I/O last 3 completed shifts: In: 2000 [I.V.:2000] Out: 525 [Urine:25; Blood:500] Total I/O In: 400 [I.V.:400] Out: -   FHT:  FHR: 150 bpm, variability: moderate,  accelerations:  Present,  decelerations:  Present variable UC:   regular, every 3-4 minutes SVE:   Dilation: 5.5 Effacement (%): 80 Station: -1 Exam by:: J.Thornton, RN    Assessment / Plan: Called to OR for emergency C/S for cord prolapse.  Surgery in progress by Faculty Service.    HARPER,CHARLES A 11/08/2013, 7:11 PM

## 2013-11-08 NOTE — Progress Notes (Signed)
Aubriana Ravelo is a 17 y.o. G1P0 at [redacted]w[redacted]d by LMP admitted for induction of labor due to Hypertension.  Subjective:   Objective: BP 138/73  Pulse 95  Temp(Src) 98.3 F (36.8 C) (Oral)  Resp 18  Ht 5' (1.524 m)  Wt 192 lb (87.091 kg)  BMI 37.50 kg/m2  SpO2 83%  LMP 02/07/2013      FHT:  FHR: 150 bpm, variability: moderate,  accelerations:  Present,  decelerations:  Absent UC:   Mild, irregular SVE:   Dilation: 1.5 Effacement (%): Thick Station: -3 Exam by:: S Nix RN  Labs: Lab Results  Component Value Date   WBC 16.7* 11/06/2013   HGB 9.5* 11/06/2013   HCT 28.9* 11/06/2013   MCV 85.0 11/06/2013   PLT 231 11/06/2013    Assessment / Plan: 37 weeks.  PIH.  2 stage IOL   Labor: Latent phase Preeclampsia:  labs stable Fetal Wellbeing:  Category I Pain Control:  Nubain I/D:  n/a Anticipated MOD:  NSVD  HARPER,CHARLES A 11/08/2013, 5:20 AM

## 2013-11-08 NOTE — Anesthesia Preprocedure Evaluation (Signed)
Anesthesia Evaluation    Airway       Dental   Pulmonary          Cardiovascular hypertension,     Neuro/Psych    GI/Hepatic   Endo/Other    Renal/GU      Musculoskeletal   Abdominal   Peds  Hematology   Anesthesia Other Findings   Reproductive/Obstetrics                           Anesthesia Physical Anesthesia Plan  ASA: I and emergent  Anesthesia Plan: General   Post-op Pain Management:    Induction: Intravenous, Rapid sequence and Cricoid pressure planned  Airway Management Planned: Oral ETT and Video Laryngoscope Planned  Additional Equipment:   Intra-op Plan:   Post-operative Plan: Extubation in OR  Informed Consent: I have reviewed the patients History and Physical, chart, labs and discussed the procedure including the risks, benefits and alternatives for the proposed anesthesia with the patient or authorized representative who has indicated his/her understanding and acceptance.   Dental advisory given  Plan Discussed with: CRNA  Anesthesia Plan Comments: (Cord prolapse. Emergent.)        Anesthesia Quick Evaluation

## 2013-11-08 NOTE — Op Note (Signed)
Cesarean Section Procedure Note   Doris Lowe   11/06/2013 - 11/08/2013  Indications: Cord Prolapse.   Pre-operative Diagnosis: cord prolapse.   Post-operative Diagnosis: Same   Surgeon: Verlin Dike  Assistants: Everardo All  Anesthesia: general  Procedure Details:  The patient was seen in the Holding Room. The risks, benefits, complications, treatment options, and expected outcomes were discussed with the patient. The patient concurred with the proposed plan, giving informed consent. The patient was identified as Doris Lowe and the procedure verified as C-Section Delivery. A Time Out was held and the above information confirmed.  After induction of anesthesia, the patient was draped and prepped in the usual sterile manner. A transverse incision was made and carried down through the subcutaneous tissue to the fascia. The fascial incision was made and extended transversely. The fascia was separated from the underlying rectus tissue superiorly and inferiorly. The peritoneum was identified and entered. The peritoneal incision was extended longitudinally. The utero-vesical peritoneal reflection was incised transversely and the bladder flap was bluntly freed from the lower uterine segment. A low transverse uterine incision was made. Delivered from cephalic presentation was a 3325 gram living newborn female infant(s). APGAR (1 MIN):  8 APGAR (5 MINS):  9 APGAR (10 MINS):    A cord ph was not sent. The umbilical cord was clamped and cut cord. A sample was obtained for evaluation. The placenta was removed Intact and appeared normal.  The uterine incision was closed with running locked sutures of 0 Vicryl. A second imbricating layer of the same suture was placed.  Hemostasis was observed. The paracolic gutters were irrigated. The parieto peritoneum was closed in a running fashion with 2-0 Vicryl.  The fascia was then reapproximated with running sutures of 0 Vicryl.  The skin was  closed with sutures.  Instrument, sponge, and needle counts were correct prior the abdominal closure and were correct at the conclusion of the case.    Findings:  Prolapsed cord.  Normal uterus, ovaries and tubes.   Estimated Blood Loss:  Total IV Fluids:   Urine Output: 25CC OF clear urine  Specimens: @ORSPECIMEN @   Complications: no complications  Disposition: PACU - hemodynamically stable.  Maternal Condition: stable   Baby condition / location:  Couplet care / Skin to Skin    Signed: Surgeon(s): Adam Phenix, MD Brock Bad, MD

## 2013-11-08 NOTE — Op Note (Signed)
NO physical consent signed pre-operatively. Patient gives verbal consent in the presence of her L&D Nurse, Dr Ike Bene and Dr. Debroah Loop prior to anesthesia induction. Ronie Fleeger [place], [place] 11/08/2013 [phone]

## 2013-11-08 NOTE — Transfer of Care (Signed)
Immediate Anesthesia Transfer of Care Note  Patient: Doris Lowe  Procedure(s) Performed: Procedure(s): CESAREAN SECTION (N/A)  Patient Location: PACU  Anesthesia Type:General  Level of Consciousness: awake and alert   Airway & Oxygen Therapy: Patient Spontanous Breathing and Patient connected to nasal cannula oxygen  Post-op Assessment: Report given to PACU RN and Post -op Vital signs reviewed and stable  Post vital signs: Reviewed and stable  Complications: No apparent anesthesia complications

## 2013-11-08 NOTE — Progress Notes (Signed)
Doris Lowe is a 17 y.o. G1P0 at [redacted]w[redacted]d by LMP admitted for induction of labor due to Hypertension.  Subjective:   Objective: BP 146/77  Pulse 130  Temp(Src) 98.2 F (36.8 C) (Oral)  Resp 18  Ht 5' (1.524 m)  Wt 192 lb (87.091 kg)  BMI 37.50 kg/m2  SpO2 83%  LMP 02/07/2013      FHT:  FHR: 150 bpm, variability: moderate,  accelerations:  Present,  decelerations:  Absent UC:   Mild, irregular SVE:   Dilation: 3 Effacement (%): 60 Station: -3 Exam by:: J.Thornton, RN  Labs: Lab Results  Component Value Date   WBC 16.7* 11/06/2013   HGB 9.5* 11/06/2013   HCT 28.9* 11/06/2013   MCV 85.0 11/06/2013   PLT 231 11/06/2013    Assessment / Plan: Induction of labor due to gestational hypertension,  progressing well on pitocin  Labor: Latent phase Preeclampsia:  labs stable Fetal Wellbeing:  Category I Pain Control:  Nubain I/D:  n/a Anticipated MOD:  NSVD  Doris Lowe A 11/08/2013, 5:43 PM

## 2013-11-09 ENCOUNTER — Encounter (HOSPITAL_COMMUNITY): Payer: Self-pay | Admitting: Obstetrics & Gynecology

## 2013-11-09 DIAGNOSIS — IMO0002 Reserved for concepts with insufficient information to code with codable children: Secondary | ICD-10-CM | POA: Diagnosis present

## 2013-11-09 LAB — CBC
HCT: 20.8 % — ABNORMAL LOW (ref 36.0–46.0)
MCHC: 32.7 g/dL (ref 30.0–36.0)
MCV: 85.6 fL (ref 78.0–100.0)
RDW: 14.8 % (ref 11.5–15.5)
WBC: 19 10*3/uL — ABNORMAL HIGH (ref 4.0–10.5)

## 2013-11-09 MED ORDER — SODIUM CHLORIDE 0.9 % IV BOLUS (SEPSIS)
500.0000 mL | Freq: Once | INTRAVENOUS | Status: AC
  Administered 2013-11-09: 09:00:00 via INTRAVENOUS

## 2013-11-09 MED ORDER — LACTATED RINGERS IV BOLUS (SEPSIS)
500.0000 mL | Freq: Once | INTRAVENOUS | Status: AC
  Administered 2013-11-09: 500 mL via INTRAVENOUS

## 2013-11-09 NOTE — Progress Notes (Signed)
Patient ID: Doris Lowe, female   DOB: 1996-10-05, 17 y.o.   MRN: [id number] Subjective: POD# 1 s/p Cesarean Delivery.  Indications: prolapsed cord  RH status/Rubella reviewed. Feeding: breast Denies HA/SOB/C/P/N/V/dizziness.  Breast symptoms: no.  She reports vaginal bleeding as normal, without clots.  She is ambulating, urinating without difficulty.     Objective: Vital signs in last 24 hours: BP 144/71  Pulse 114  Temp(Src) 98.8 F (37.1 C) (Axillary)  Resp 22  Ht 5' (1.524 m)  Wt 87.091 kg (192 lb)  BMI 37.50 kg/m2  SpO2 95%  LMP 02/07/2013   Total I/O In: 360 [P.O.:360] Out: -    Physical Exam:  General: alert CV: Regular rate and rhythm Resp: clear Abdomen: soft, nontender, normal bowel sounds Lochia: minimal Uterine Fundus: firm, below umbilicus, nontender Incision: clean, dry and intact Ext: extremities normal, atraumatic, no cyanosis or edema    Recent Labs  11/06/13 2045 11/09/13 0600  HGB 9.5* 6.8*  HCT 28.9* 20.8*      Assessment/Plan: 17 y.o.  status post Cesarean section. POD# 1.   PIH--edematous; B/Ps controlled at present Anemia--stable Decreased intravascular volume, urine output  Repeat IVF bolus now Advance diet as tolerated Start po pain meds Ambulate IS Routine post-op care  JACKSON-MOORE,Doris Lowe 11/09/2013, 8:19 AM

## 2013-11-09 NOTE — Anesthesia Postprocedure Evaluation (Signed)
  Anesthesia Post-op Note  Patient: Doris Lowe  Procedure(s) Performed: Procedure(s): CESAREAN SECTION (N/A)  Patient Location: Mother/Baby  Anesthesia Type:General  Level of Consciousness: awake, alert  and oriented  Airway and Oxygen Therapy: Patient Spontanous Breathing  Post-op Pain: none  Post-op Assessment: Post-op Vital signs reviewed, Patient's Cardiovascular Status Stable and Respiratory Function Stable  Post-op Vital Signs: Reviewed and stable  Complications: No apparent anesthesia complications

## 2013-11-09 NOTE — Anesthesia Postprocedure Evaluation (Signed)
Anesthesia Post Note  Patient: Doris Lowe  Procedure(s) Performed: Procedure(s) (LRB): CESAREAN SECTION (N/A)  Anesthesia type: General  Patient location: PACU  Post pain: Pain level controlled  Post assessment: Post-op Vital signs reviewed  Last Vitals: BP 137/73  Pulse 164  Temp(Src) 37.1 C (Axillary)  Resp 19  Ht 5' (1.524 m)  Wt 192 lb (87.091 kg)  BMI 37.50 kg/m2  SpO2 94%  LMP 02/07/2013  Post vital signs: Reviewed  Level of consciousness: sedated  Complications: No apparent anesthesia complications

## 2013-11-09 NOTE — Lactation Note (Signed)
This note was copied from the chart of Doris Lowe. Lactation Consultation Note  Patient Name: Doris Miyanna Wiersma [id number] Date: 11/09/2013 Reason for consult: Follow-up assessment Follow-up consult, baby 27 hours old. Mom reports baby has been sleepy, not interested in feeding. Reviewed waking techniques and feeding cues with mom. Mom pre-pumped with DEBP to pull nipple out. Attempted to latch baby directly to nipple, unsuccessfully. Assisted mom to latch baby with #24 NS. Baby latched well, good rhythmic suckling bursts, with a few swallows observed. No colostrum noted in NS or with hand expression. Mom encouraged to pre-pump and try to latch baby first without NS at each feeding. Then use NS if needed and feed on demand, post-pumping at least 6 times per day. Mom enc to call out for help if needed or if baby skips a feeding.  Maternal Data    Feeding Feeding Type: Breast Fed Length of feed:  (observed 8 minute feeding)  LATCH Score/Interventions Latch: Repeated attempts needed to sustain latch, nipple held in mouth throughout feeding, stimulation needed to elicit sucking reflex. Intervention(s): Adjust position;Assist with latch;Breast compression  Audible Swallowing: A few with stimulation Intervention(s): Skin to skin;Hand expression  Type of Nipple: Flat (Semi-erect after pumping, invert with compression.) Intervention(s): Double electric pump  Comfort (Breast/Nipple): Soft / non-tender     Hold (Positioning): Assistance needed to correctly position infant at breast and maintain latch. Intervention(s): Skin to skin;Breastfeeding basics reviewed  LATCH Score: 6  Lactation Tools Discussed/Used Tools: Nipple Shields Nipple shield size: 24   Consult Status Date: 11/10/13 Follow-up type: In-patient    Geralynn Ochs 11/09/2013, 5:35 PM

## 2013-11-10 LAB — TYPE AND SCREEN
ABO/RH(D): B POS
Antibody Screen: NEGATIVE
Unit division: 0

## 2013-11-10 NOTE — Progress Notes (Signed)
UR completed 

## 2013-11-10 NOTE — Progress Notes (Addendum)
Post Partum Day 1 Subjective: no complaints, up ad lib, voiding, tolerating PO and + flatus  Objective: Blood pressure 131/55, pulse 106, temperature 98.9 F (37.2 C), temperature source Oral, resp. rate 20, height 5' (1.524 m), weight 192 lb (87.091 kg), last menstrual period 02/07/2013, SpO2 98.00%, unknown if currently breastfeeding.  Physical Exam:  General: alert and cooperative Lochia: appropriate Uterine Fundus: firm Incision: healing well DVT Evaluation: No evidence of DVT seen on physical exam.   Recent Labs  11/09/13 0600  HGB 6.8*  HCT 20.8*    Assessment/Plan: Breastfeeding and Lactation consult Anemia, plan iron for discharge Afebrile, Normotensive Normal involution   LOS: 4 days   Davetta Olliff 11/10/2013, 8:25 AM

## 2013-11-11 MED ORDER — FERROUS SULFATE 325 (65 FE) MG PO TABS: 325.0000 mg | ORAL_TABLET | Freq: Two times a day (BID) | ORAL | Status: DC

## 2013-11-11 MED ORDER — OXYCODONE-ACETAMINOPHEN 5-325 MG PO TABS: 1.0000 | ORAL_TABLET | ORAL | Status: DC | PRN

## 2013-11-11 NOTE — Progress Notes (Signed)
Honeycomb dressing removed to remove staples. No staples present on pt's incision. Informed pt of incision care.

## 2013-11-11 NOTE — Discharge Summary (Signed)
  Obstetric Discharge Summary Reason for Admission: onset of labor Prenatal Procedures: none Intrapartum Procedures: cesarean: low cervical, transverse Postpartum Procedures: none Complications-Operative and Postpartum: none  Hemoglobin  Date Value Range Status  11/09/2013 6.8* 12.0 - 15.0 g/dL Final     REPEATED TO VERIFY     CRITICAL RESULT CALLED TO, READ BACK BY AND VERIFIED WITH:     RIFFEY,S. @ 4098 ON 11/09/2013 BY [email or web address].A     HCT  Date Value Range Status  11/09/2013 20.8* 36.0 - 46.0 % Final    Physical Exam:  General: alert Lochia: appropriate Uterine: firm Incision: clean, dry and intact DVT Evaluation: No evidence of DVT seen on physical exam.  Discharge Diagnoses: Active Problems:   PIH (pregnancy induced hypertension)   Umbilical cord prolapse in labor and delivery, delivered   Maternal anemia complicating pregnancy, childbirth, or the puerperium   Cesarean delivery delivered   Discharge Information: Date: 11/11/2013 Activity: pelvic rest Diet: routine Medications:  Prior to Admission medications   Medication Sig Start Date End Date Taking? Authorizing Provider  acetaminophen (TYLENOL) 325 MG tablet Take 325 mg by mouth every 6 (six) hours as needed for headache.   Yes Historical Provider, MD  Prenatal Vit-Fe Fumarate-FA (MULTIVITAMIN-PRENATAL) 27-0.8 MG TABS Take 1 tablet by mouth daily at 12 noon.   Yes Historical Provider, MD  ferrous sulfate 325 (65 FE) MG tablet Take 1 tablet (325 mg total) by mouth 2 (two) times daily before a meal. 11/11/13   Antionette Char, MD  oxyCODONE-acetaminophen (PERCOCET/ROXICET) 5-325 MG per tablet Take 1-2 tablets by mouth every 4 (four) hours as needed for severe pain (moderate - severe pain). 11/11/13   Antionette Char, MD    Condition: stable Instructions: refer to routine discharge instructions Discharge to: home Follow-up Information   Follow up with Antionette Char A, MD. Schedule an appointment as soon  as possible for a visit on 11/16/2013.   Specialty:  Obstetrics and Gynecology   Contact information:   [address] [place] [address] [phone]       Newborn Data:  Live born female  Birth Weight: 7 lb 5.3 oz (3325 g) APGAR: 8, 9   Home with mother.  JACKSON-MOORE,Orabelle Rylee A 11/11/2013, 9:14 AM

## 2013-11-18 ENCOUNTER — Telehealth: Payer: Self-pay | Admitting: *Deleted

## 2013-11-22 ENCOUNTER — Inpatient Hospital Stay (EMERGENCY_DEPARTMENT_HOSPITAL)
Admission: AD | Admit: 2013-11-22 | Discharge: 2013-11-23 | Disposition: A | Discharge status: Home / Self Care | Payer: Medicaid Other | Source: Ambulatory Visit | Attending: Obstetrics | Admitting: Obstetrics

## 2013-11-22 ENCOUNTER — Encounter (HOSPITAL_COMMUNITY): Payer: Self-pay | Admitting: *Deleted

## 2013-11-22 ENCOUNTER — Encounter (HOSPITAL_COMMUNITY): Payer: Self-pay | Admitting: Emergency Medicine

## 2013-11-22 ENCOUNTER — Emergency Department (HOSPITAL_COMMUNITY)
Admission: EM | Admit: 2013-11-22 | Discharge: 2013-11-22 | Disposition: A | Discharge status: Home / Self Care | Payer: Medicaid Other | Source: Home / Self Care | Attending: Emergency Medicine | Admitting: Emergency Medicine

## 2013-11-22 DIAGNOSIS — O909 Complication of the puerperium, unspecified: Principal | ICD-10-CM | POA: Diagnosis present

## 2013-11-22 DIAGNOSIS — L03319 Cellulitis of trunk, unspecified: Secondary | ICD-10-CM

## 2013-11-22 DIAGNOSIS — L02219 Cutaneous abscess of trunk, unspecified: Secondary | ICD-10-CM | POA: Diagnosis present

## 2013-11-22 DIAGNOSIS — D649 Anemia, unspecified: Secondary | ICD-10-CM

## 2013-11-22 DIAGNOSIS — T814XXA Infection following a procedure, initial encounter: Principal | ICD-10-CM

## 2013-11-22 DIAGNOSIS — O9081 Anemia of the puerperium: Secondary | ICD-10-CM | POA: Diagnosis present

## 2013-11-22 DIAGNOSIS — O900 Disruption of cesarean delivery wound: Secondary | ICD-10-CM | POA: Diagnosis present

## 2013-11-22 DIAGNOSIS — IMO0001 Reserved for inherently not codable concepts without codable children: Secondary | ICD-10-CM

## 2013-11-22 DIAGNOSIS — T8131XA Disruption of external operation (surgical) wound, not elsewhere classified, initial encounter: Secondary | ICD-10-CM

## 2013-11-22 DIAGNOSIS — D72829 Elevated white blood cell count, unspecified: Secondary | ICD-10-CM | POA: Diagnosis present

## 2013-11-22 LAB — CBC
HCT: 21.8 % — ABNORMAL LOW (ref 36.0–46.0)
Hemoglobin: 6.9 g/dL — CL (ref 12.0–15.0)
MCH: 27.4 pg (ref 26.0–34.0)
MCHC: 31.7 g/dL (ref 30.0–36.0)
MCV: 86.5 fL (ref 78.0–100.0)
Platelets: 690 10*3/uL — ABNORMAL HIGH (ref 150–400)
RBC: 2.52 MIL/uL — AB (ref 3.87–5.11)
RDW: 15.7 % — ABNORMAL HIGH (ref 11.5–15.5)
WBC: 23 10*3/uL — AB (ref 4.0–10.5)

## 2013-11-22 MED ORDER — OXYCODONE-ACETAMINOPHEN 5-325 MG PO TABS
1.0000 | ORAL_TABLET | Freq: Once | ORAL | Status: AC
  Administered 2013-11-22: 1 via ORAL

## 2013-11-22 MED ORDER — OXYCODONE-ACETAMINOPHEN 5-325 MG PO TABS
ORAL_TABLET | ORAL | Status: AC
  Filled 2013-11-22: qty 1

## 2013-11-22 MED ORDER — OXYCODONE-ACETAMINOPHEN 5-325 MG PO TABS: 1.0000 | ORAL_TABLET | ORAL | Status: DC | PRN

## 2013-11-22 NOTE — Discharge Instructions (Signed)
Anemia, Nonspecific Anemia is a condition in which the concentration of red blood cells or hemoglobin in the blood is below normal. Hemoglobin is a substance in red blood cells that carries oxygen to the tissues of the body. Anemia results in not enough oxygen reaching these tissues.  CAUSES  Common causes of anemia include:   Excessive bleeding. Bleeding may be internal or external. This includes excessive bleeding from periods (in women) or from the intestine.   Poor nutrition.   Chronic kidney, thyroid, and liver disease.  Bone marrow disorders that decrease red blood cell production.  Cancer and treatments for cancer.  HIV, AIDS, and their treatments.  Spleen problems that increase red blood cell destruction.  Blood disorders.  Excess destruction of red blood cells due to infection, medicines, and autoimmune disorders. SIGNS AND SYMPTOMS   Minor weakness.   Dizziness.   Headache.  Palpitations.   Shortness of breath, especially with exercise.   Paleness.  Cold sensitivity.  Indigestion.  Nausea.  Difficulty sleeping.  Difficulty concentrating. Symptoms may occur suddenly or they may develop slowly.  DIAGNOSIS  Additional blood tests are often needed. These help your health care provider determine the best treatment. Your health care provider will check your stool for blood and look for other causes of blood loss.  TREATMENT  Treatment varies depending on the cause of the anemia. Treatment can include:   Supplements of iron, vitamin B12, or folic acid.   Hormone medicines.   A blood transfusion. This may be needed if blood loss is severe.   Hospitalization. This may be needed if there is significant continual blood loss.   Dietary changes.  Spleen removal. HOME CARE INSTRUCTIONS Keep all follow-up appointments. It often takes many weeks to correct anemia, and having your health care provider check on your condition and your response to  treatment is very important. SEEK IMMEDIATE MEDICAL CARE IF:   You develop extreme weakness, shortness of breath, or chest pain.   You become dizzy or have trouble concentrating.  You develop heavy vaginal bleeding.   You develop a rash.   You have bloody or black, tarry stools.   You faint.   You vomit up blood.   You vomit repeatedly.   You have abdominal pain.  You have a fever or persistent symptoms for more than 2 3 days.   You have a fever and your symptoms suddenly get worse.   You are dehydrated.  MAKE SURE YOU:  Understand these instructions.  Will watch your condition.  Will get help right away if you are not doing well or get worse. Document Released: 12/13/2004 Document Revised: 07/08/2013 Document Reviewed: 05/01/2013 [facility] Patient Information 2014 [employer] [place].  Wound Dehiscence Wound dehiscence is when a cut (incision) breaks open and does not heal properly after surgery. It usually happens 7 to 10 days after surgery. When a wound dehiscence is noticed and treated early, it is not always dangerous.  CAUSES   Stretching of the wound area caused by lifting, vomiting, violent coughing, or straining due to constipation.  Wound infection.  Early stitch (suture) removal.  Poor nutrition prior to surgery. Other risk factors include:  Obesity.  Lung disease.  Smoking.  Poor nutrition.  Contamination during surgery. SYMPTOMS  Bleeding from the wound.  Pain.  Fever.  Wound starts breaking open. DIAGNOSIS  Your caregiver may diagnose wound dehiscence by monitoring the incision and noting wound changes. These changes can include an increase in drainage, pain, or complaints of  stretching or tearing of the wound.  Wound cultures may be taken to determine if there is an infection.  Imaging studies, such as an MRI scan or CT scan, may be done to determine if there is a collection of pus or fluid. TREATMENT Treatment may  include:  Wound care.  Surgical repair.  Antibiotic medicine.  Medicines to reduce pain and swelling. HOME CARE INSTRUCTIONS   Only take over-the-counter or prescription medicines for pain, discomfort, or fever as directed by your caregiver. Taking pain medicine 30 minutes before changing a bandage (dressing) can help relieve pain.  Take your antibiotics as directed. Finish them even if you start to feel better.  Gently wash the area with mild soap and water 2 times a day, or as directed. Rinse off the soap. Pat the area dry with a clean towel. Do not rub the wound. This may cause bleeding.  Follow your caregiver's instructions for how often you need to change the dressing and packing inside. Wash your hands well before and after changing your dressing. Apply a dressing to the wound as directed.  Take showers. Do not take tub baths, swim, or do anything that may soak the wound until it is healed.  Avoid exercises that make you sweat heavily.  Use anti-itch medicine as directed by your caregiver. The wound may itch when it is healing. Do not pick or scratch at the wound.  Do not lift more than 10 pounds (4.5 kg) until the wound is healed, or as directed by your caregiver.  Keep all follow-up appointments as directed. SEEK IMMEDIATE MEDICAL CARE IF:   You have increased swelling or redness around the wound.  You have increasing pain in the wound.  You have an increasing amount of pus coming from the wound.  More of the wound breaks open.  You have a fever. MAKE SURE YOU:   Understand these instructions.  Will watch your condition.  Will get help right away if you are not doing well or get worse. Document Released: 01/26/2004 Document Revised: 01/28/2012 Document Reviewed: 03/11/2011 [facility] Patient Information 2014 [place], [place].

## 2013-11-22 NOTE — ED Notes (Signed)
Assisted pt to bedpan, pt tolerated well,

## 2013-11-22 NOTE — ED Notes (Signed)
When dressing removed, bleeding noted to incision site, hard area above incision site, area warm to touch, pt states that she has been running a fever a few days ago, temp 99.3 in er, pt has swelling to bilateral lower extremities which pt states is better than it was during her pregnancy. Dr. Rubin PayorPIckering at bedside for evaluation,

## 2013-11-22 NOTE — ED Notes (Signed)
CRITICAL VALUE ALERT  Critical value received: hgb 6.9  Date of notification:  01/04/015  Time of notification:  1613  Critical value read back: yes  Nurse who received alert:  Garald BraverJ. Johnell Landowski rn  MD notified (1st page):  N. pickering  Time of first page: 1613  MD notified (2nd page):  Time of second page:  Responding MD: n pickering  Time MD responded: [phone]

## 2013-11-22 NOTE — ED Provider Notes (Signed)
CSN: [id number]     Arrival date & time 11/22/13  1539 History   First MD Initiated Contact with Patient 11/22/13 1549     Chief Complaint  Patient presents with  . Wound Check   (Consider location/radiation/quality/duration/timing/severity/associated sxs/prior Treatment) Patient is a 18 y.o. female presenting with wound check. The history is provided by the patient.  Wound Check Pertinent negatives include no chest pain, no abdominal pain, no headaches and no shortness of breath.   patient had a cesarean section around 12 days ago. She states she was in the bathroom today and felt something running on her abdomen. She found blood coming out of the wound on her abdomen with a slight opening of the wound she states that she was told that she bled a quarter of a quart of blood. She has some pain at the site. She's had decreasing abdominal bleeding since the delivery. No fevers. No lightheadedness or dizziness.  Past Medical History  Diagnosis Date  . Hypertension   . Pregnancy induced hypertension   . Chlamydia   . Bacteriuria    Past Surgical History  Procedure Laterality Date  . No past surgeries    . Cesarean section N/A 11/08/2013    Procedure: CESAREAN SECTION;  Surgeon: Adam PhenixJames G Arnold, MD;  Location: [facility];  Service: Obstetrics;  Laterality: N/A;   Family History  Problem Relation Age of Onset  . Stroke Father   . Cancer Maternal Grandmother     Ovarian  . Cancer Maternal Grandfather     Prostate  . Dementia Paternal Grandmother   . Hypertension Paternal Grandfather    History  Substance Use Topics  . Smoking status: Never Smoker   . Smokeless tobacco: Not on file  . Alcohol Use: No   OB History   Grav Para Term Preterm Abortions TAB SAB Ect Mult Living   1 1 1       1      Review of Systems  Constitutional: Negative for activity change and appetite change.  Eyes: Negative for pain.  Respiratory: Negative for chest tightness and shortness of breath.    Cardiovascular: Negative for chest pain and leg swelling.  Gastrointestinal: Negative for nausea, vomiting, abdominal pain and diarrhea.  Genitourinary: Positive for vaginal bleeding. Negative for flank pain.  Musculoskeletal: Negative for back pain and neck stiffness.  Skin: Positive for wound. Negative for rash.  Neurological: Negative for weakness, numbness and headaches.  Psychiatric/Behavioral: Negative for behavioral problems.    Allergies  Sulfa antibiotics  Home Medications   Current Outpatient Rx  Name  Route  Sig  Dispense  Refill  . ferrous sulfate 325 (65 FE) MG tablet   Oral   Take 1 tablet (325 mg total) by mouth 2 (two) times daily before a meal.   60 tablet   11   . oxyCODONE-acetaminophen (PERCOCET/ROXICET) 5-325 MG per tablet   Oral   Take 1-2 tablets by mouth every 4 (four) hours as needed for severe pain (moderate - severe pain).   30 tablet   0   . Prenatal Vit-Fe Fumarate-FA (MULTIVITAMIN-PRENATAL) 27-0.8 MG TABS   Oral   Take 1 tablet by mouth daily at 12 noon.          BP 155/84  Pulse 112  Temp(Src) 99.3 F (37.4 C) (Oral)  Resp 16  Ht 5' (1.524 m)  Wt 180 lb (81.647 kg)  BMI 35.15 kg/m2  SpO2 97%  LMP 02/07/2013 Physical Exam  Nursing note and vitals  reviewed. Constitutional: She is oriented to person, place, and time. She appears well-developed and well-nourished.  HENT:  Head: Normocephalic and atraumatic.  Eyes: EOM are normal. Pupils are equal, round, and reactive to light.  Neck: Normal range of motion. Neck supple.  Cardiovascular: Regular rhythm and normal heart sounds.   No murmur heard. Tachycardia  Pulmonary/Chest: Effort normal and breath sounds normal. No respiratory distress. She has no wheezes. She has no rales.  Abdominal: Soft. Bowel sounds are normal. She exhibits mass. She exhibits no distension. There is tenderness. There is no rebound and no guarding.  Patient with suprapubic fullness. There is an overall  well-healing incision from first C-section. There is a small half centimeter wound dehiscence towards the lateral aspect of the wound. There is red blood coming out. No purulence. No pulsatile bleeding  Musculoskeletal: Normal range of motion.  Neurological: She is alert and oriented to person, place, and time. No cranial nerve deficit.  Skin: Skin is warm and dry. There is pallor.  Psychiatric: She has a normal mood and affect. Her speech is normal.    ED Course  Procedures (including critical care time) Labs Review Labs Reviewed  CBC - Abnormal; Notable for the following:    WBC 23.0 (*)    RBC 2.52 (*)    Hemoglobin 6.9 (*)    HCT 21.8 (*)    RDW 15.7 (*)    Platelets 690 (*)    All other components within normal limits   Imaging Review No results found.  EKG Interpretation   None       MDM  No diagnosis found. Patient was bleeding from slightly dehisced wound. Bleeding is controlled with direct pressure. Hemoglobin is low at 6.9, however this is higher than it was on her discharge. Her heart rate is improved. She does not feel more lightheaded today than she did yesterday. She will be discharged home. Steri-Strips were placed over the wound and family was given dressing supplies.    Juliet Rude. Rubin Payor, MD 11/22/13 [phone]

## 2013-11-22 NOTE — ED Notes (Signed)
Pt requesting something for pain, Dr. Rubin PayorPickering notified, additional orders given

## 2013-11-22 NOTE — MAU Provider Note (Signed)
History     CSN: [id number]  Arrival date and time: 11/22/13 2321   First Provider Initiated Contact with Patient 11/22/13 2346      Chief Complaint  Patient presents with  . Post-op Problem   HPI  Doris Lowe is a 18 y.o. G1P1001 s/p c-section. She states that around 1400 today her wound started bleeding. She went to [facility], and they applied steri-strips to the area. She is back now with continued bleeding from her incision.   Past Medical History  Diagnosis Date  . Hypertension   . Pregnancy induced hypertension   . Chlamydia   . Bacteriuria     Past Surgical History  Procedure Laterality Date  . No past surgeries    . Cesarean section N/A 11/08/2013    Procedure: CESAREAN SECTION;  Surgeon: Adam Phenix, MD;  Location: [facility];  Service: Obstetrics;  Laterality: N/A;    Family History  Problem Relation Age of Onset  . Stroke Father   . Cancer Maternal Grandmother     Ovarian  . Cancer Maternal Grandfather     Prostate  . Dementia Paternal Grandmother   . Hypertension Paternal Grandfather     History  Substance Use Topics  . Smoking status: Never Smoker   . Smokeless tobacco: Not on file  . Alcohol Use: No    Allergies:  Allergies  Allergen Reactions  . Sulfa Antibiotics Hives    Prescriptions prior to admission  Medication Sig Dispense Refill  . Prenatal Vit-Fe Fumarate-FA (MULTIVITAMIN-PRENATAL) 27-0.8 MG TABS Take 1 tablet by mouth daily at 12 noon.      . ferrous sulfate 325 (65 FE) MG tablet Take 1 tablet (325 mg total) by mouth 2 (two) times daily before a meal.  60 tablet  11  . oxyCODONE-acetaminophen (PERCOCET/ROXICET) 5-325 MG per tablet Take 1-2 tablets by mouth every 4 (four) hours as needed for severe pain (moderate - severe pain).  10 tablet  0    ROS Physical Exam   Blood pressure 161/92, pulse 104, temperature 98.4 F (36.9 C), temperature source Oral, resp. rate 16, height 5' (1.524 m), weight 81.557 kg (179 lb 12.8 oz),  last menstrual period 02/07/2013, not currently breastfeeding.  Physical Exam  Nursing note and vitals reviewed. Constitutional: She is oriented to person, place, and time. She appears well-developed and well-nourished. No distress.  Cardiovascular: Normal rate.   Respiratory: Effort normal.  GI: Soft.  Fundus: 1 below umbilicus Bleeding from the incision. Applied pressure to the area and unable to stop the bleeding.   Neurological: She is alert and oriented to person, place, and time.  Skin: Skin is warm and dry. There is pallor.  Psychiatric: She has a normal mood and affect.   Incision opened up, and a small amount of blood was expressed from the incision. Incision then packed with iodoform guaze and pressure dressing applied.   MAU Course  Procedures  2356: D/W Dr. Gaynell Face, open up area with a q-tip, and then pack the wound. Have her FU with [facility] in the morning.  9562: Dr. Gaynell Face updated with critical H&H. No new orders. Will send her own with iron.   Assessment and Plan   1. Wound, surgical, infected, initial encounter   Patient has not filled her iron prescription. Discussed importance, will start taking    Follow-up Information   Call HARPER,CHARLES A, MD. (in the morning )    Specialty:  Obstetrics and Gynecology   Contact information:   [phone]  [address] [place] [id number] [phone]        Medication List         ferrous sulfate 325 (65 FE) MG tablet  Take 1 tablet (325 mg total) by mouth 2 (two) times daily before a meal.     multivitamin-prenatal 27-0.8 MG Tabs tablet  Take 1 tablet by mouth daily at 12 noon.     oxyCODONE-acetaminophen 5-325 MG per tablet  Commonly known as:  PERCOCET/ROXICET  Take 1-2 tablets by mouth every 4 (four) hours as needed for severe pain (moderate - severe pain).         Tawnya CrookHogan, Tanicia Wolaver Donovan 11/22/2013, 11:56 PM

## 2013-11-22 NOTE — MAU Note (Signed)
Pt post C/S on 11/08/2013, bleeding from incision around 2pm, [facility], steri strips applied.  Incision started bleeding again [facility].

## 2013-11-22 NOTE — ED Notes (Signed)
Pt [facility], went to bathroom today and noticed blood coming from her incision site.

## 2013-11-22 NOTE — ED Notes (Signed)
Dr, Rubin PayorPickering at bedside,

## 2013-11-23 ENCOUNTER — Encounter: Payer: Self-pay | Admitting: Obstetrics & Gynecology

## 2013-11-23 ENCOUNTER — Ambulatory Visit (INDEPENDENT_AMBULATORY_CARE_PROVIDER_SITE_OTHER): Payer: Medicaid Other | Admitting: Obstetrics

## 2013-11-23 ENCOUNTER — Inpatient Hospital Stay (HOSPITAL_COMMUNITY)
Admission: AD | Admit: 2013-11-23 | Discharge: 2013-11-26 | DRG: 776 | Disposition: A | Discharge status: Home / Self Care | Payer: Medicaid Other | Source: Ambulatory Visit | Attending: Obstetrics | Admitting: Obstetrics

## 2013-11-23 ENCOUNTER — Encounter: Payer: Self-pay | Admitting: Obstetrics

## 2013-11-23 ENCOUNTER — Encounter (HOSPITAL_COMMUNITY): Payer: Self-pay | Admitting: Obstetrics

## 2013-11-23 VITALS — BP 166/99 | HR 121 | Temp 99.6°F | Ht 60.0 in | Wt 178.0 lb

## 2013-11-23 DIAGNOSIS — O8600 Infection of obstetric surgical wound, unspecified: Secondary | ICD-10-CM | POA: Diagnosis present

## 2013-11-23 DIAGNOSIS — O909 Complication of the puerperium, unspecified: Secondary | ICD-10-CM

## 2013-11-23 DIAGNOSIS — T8140XA Infection following a procedure, unspecified, initial encounter: Secondary | ICD-10-CM

## 2013-11-23 LAB — CBC
HEMATOCRIT: 20.2 % — AB (ref 36.0–46.0)
Hemoglobin: 6.4 g/dL — CL (ref 12.0–15.0)
MCH: 27.1 pg (ref 26.0–34.0)
MCHC: 31.7 g/dL (ref 30.0–36.0)
MCV: 85.6 fL (ref 78.0–100.0)
PLATELETS: 756 10*3/uL — AB (ref 150–400)
RBC: 2.36 MIL/uL — ABNORMAL LOW (ref 3.87–5.11)
RDW: 15.9 % — ABNORMAL HIGH (ref 11.5–15.5)
WBC: 23.6 10*3/uL — ABNORMAL HIGH (ref 4.0–10.5)

## 2013-11-23 MED ORDER — ZOLPIDEM TARTRATE 5 MG PO TABS
5.0000 mg | ORAL_TABLET | Freq: Every evening | ORAL | Status: DC | PRN
Start: 2013-11-23 — End: 2013-11-26

## 2013-11-23 MED ORDER — PRENATAL MULTIVITAMIN CH
1.0000 | ORAL_TABLET | Freq: Every day | ORAL | Status: DC
  Administered 2013-11-25 – 2013-11-26 (×2): 1 via ORAL
  Filled 2013-11-23 (×3): qty 1

## 2013-11-23 MED ORDER — HYDROMORPHONE HCL PF 1 MG/ML IJ SOLN
1.0000 mg | Freq: Once | INTRAMUSCULAR | Status: AC
  Administered 2013-11-23: 1 mg via INTRAMUSCULAR
  Filled 2013-11-23: qty 1

## 2013-11-23 MED ORDER — OXYCODONE-ACETAMINOPHEN 5-325 MG PO TABS
1.0000 | ORAL_TABLET | ORAL | Status: DC | PRN
  Administered 2013-11-23: 2 via ORAL
  Filled 2013-11-23: qty 2

## 2013-11-23 MED ORDER — PIPERACILLIN-TAZOBACTAM 3.375 G IVPB
3.3750 g | Freq: Three times a day (TID) | INTRAVENOUS | Status: DC
  Administered 2013-11-23 – 2013-11-26 (×9): 3.375 g via INTRAVENOUS
  Filled 2013-11-23 (×11): qty 50

## 2013-11-23 MED ORDER — ONDANSETRON HCL 4 MG/2ML IJ SOLN: 4.0000 mg | Freq: Four times a day (QID) | INTRAMUSCULAR | Status: DC | PRN

## 2013-11-23 MED ORDER — HYDROMORPHONE HCL PF 1 MG/ML IJ SOLN
0.2000 mg | INTRAMUSCULAR | Status: DC | PRN
  Administered 2013-11-24: 0.6 mg via INTRAVENOUS
  Filled 2013-11-23: qty 1

## 2013-11-23 MED ORDER — HYDROMORPHONE HCL 2 MG PO TABS
4.0000 mg | ORAL_TABLET | Freq: Four times a day (QID) | ORAL | Status: DC | PRN
  Administered 2013-11-23 – 2013-11-26 (×7): 4 mg via ORAL
  Filled 2013-11-23 (×10): qty 2

## 2013-11-23 MED ORDER — ONDANSETRON HCL 4 MG PO TABS: 4.0000 mg | ORAL_TABLET | Freq: Four times a day (QID) | ORAL | Status: DC | PRN

## 2013-11-23 MED ORDER — DEXTROSE-NACL 5-0.45 % IV SOLN
INTRAVENOUS | Status: DC
  Administered 2013-11-23 – 2013-11-26 (×5): via INTRAVENOUS

## 2013-11-23 MED ORDER — SIMETHICONE 80 MG PO CHEW: 80.0000 mg | CHEWABLE_TABLET | Freq: Four times a day (QID) | ORAL | Status: DC | PRN

## 2013-11-23 MED ORDER — DOCUSATE SODIUM 100 MG PO CAPS
100.0000 mg | ORAL_CAPSULE | Freq: Two times a day (BID) | ORAL | Status: DC
  Administered 2013-11-23 – 2013-11-26 (×5): 100 mg via ORAL
  Filled 2013-11-23 (×5): qty 1

## 2013-11-23 MED ORDER — IBUPROFEN 800 MG PO TABS
800.0000 mg | ORAL_TABLET | Freq: Four times a day (QID) | ORAL | Status: DC
  Administered 2013-11-23 – 2013-11-26 (×11): 800 mg via ORAL
  Filled 2013-11-23 (×12): qty 1

## 2013-11-23 NOTE — Progress Notes (Signed)
Subjective:   Pt in office 2 weeks  Post partum, reports was at hospital for incisional bleeding X2, reports fever of 102 last week, was not seen at that time, reports pain at present 5 out of 10 .  Blood pressure elevated   166/99 left arm, repeat BP 153/104 right arm    Doris Lowe is a 18 y.o. female who presents for a postpartum visit. She is 2 weeks postpartum following a c-section. I have fully reviewed the prenatal and intrapartum course. The delivery was at 37 gestational weeks. Outcome: c-section. Anesthesia: general. Postpartum course has been incision pain and bloody drainage. Baby's course has been normal. Baby is feeding by Not asked. Bleeding thin lochia. Bowel function is normal. Bladder function is normal. Patient is not sexually active. Contraception method is none. Postpartum depression screening: positive. Score 4 mild depression  The following portions of the patient's history were reviewed and updated as appropriate: allergies, current medications, past family history, past medical history, past social history, past surgical history and problem list.  Review of Systems Pertinent items are noted in HPI.   Objective:    BP 166/99  Pulse 121  Temp(Src) 99.6 F (37.6 C)  Ht 5' (1.524 m)  Wt 178 lb (80.74 kg)  BMI 34.76 kg/m2  LMP 02/07/2013  General:  alert and no distress   Breasts:  inspection negative, no nipple discharge or bleeding, no masses or nodularity palpable Abdomen:  Incision draining port wine fluid, tender, packing soaked.                   Packing removed and the incision opened and probed                    down to fascia throughout the cavity.  The fascia was                    intact.  Clot removed and drained and the incision                    packed with moistened Kerlex gauze.  Bandage applied                   to incision.  1% Xylocaine used as an anesthetic SQ.                               Assessment:    Post Op C/S, 2 weeks.   Wound infection / hematoma / cellulitis.  I&D and packing done.    [facility] with [facility] antibiotics because of the extensive cellulitis, pain, fever, anemia and leukocytosis ( WBC = 23 K ).  Plan:    1. Contraception: abstinence 2. Check wound cultures. 3. [facility].

## 2013-11-23 NOTE — Care Management Note (Addendum)
    Page 1 of 2   11/25/2013     5:07:19 PM   CARE MANAGEMENT NOTE 11/25/2013  Patient:  Doris Lowe,Doris Lowe   Account Number:  [id number]  Date Initiated:  11/23/2013  Documentation initiated by:  Emilio MathGAINES,Clevie Prout  Subjective/Objective Assessment:     Action/Plan:   Home Health for wound care.   Anticipated DC Date:  11/26/2013   Anticipated DC Plan:  HOME W HOME HEALTH SERVICES      DC Planning Services  CM consult      Choice offered to / List presented to:  C-1 Patient   [facility]      [facility] agency  [facility]     [facility]  [facility] RN      [facility]  [facility]   Status of service:  In process, will continue to follow  Discharge Disposition:  HOME W HOME HEALTH SERVICES   Comments:   11/25/13 0845 L. Floyce StakesGaines [facility] B[facility]-  CM went to see patient to follow up on WOC- consult from 11/24/13. Reccomendations for Wound VAC for home use.  Nurse Case Manager called Maple HudsonKaren Sanders pager # [phone]  WOC RN  and also Dr. Clearance CootsHarper for plan.  Orders for patient to have wound VAC  - [facility]- preferred by Dr. Clearance CootsHarper and [facility] forms faxed to CM to be filled out and signed by physician to receive approval process.  Forms filled out  by Case Manager and signed by Dr. Clearance CootsHarper and [facility] back to [facility] with [facility].  Spoke to patient and she [facility].  Referral sent to [facility] [phone].  Per Shelda AltesKaren WOC RN- one of the WOC RN's will be back tomorrow for dressing change- THursday.  1st nursing visit will be needed with Home Health on Saturday 11/28/13 , Kristen with [facility] aware and will start nursing visits then. Recieved call from [facility] Toye with [facility] # [phone] and stated patient approved for Wound Vac and she delivered this VAC to room around 1630 today. Spoke to patient and she in agreement with plan. Spoke to Dr. Clearance CootsHarper and he is aware of HH and VAC that is set up for discharge.  Please call Case  Management for any needs.  Baxter Hire***Kristen with [facility]- # [phone] *** Oralia RudRicky Toye- [facility] rep. # [phone]-- please call for any problems with Home VAC ****Maple HudsonKaren Sanders- WOC  [facility]- pager - [phone]   11/23/13 1545 L. Floyce StakesGaines [facility] BSN # [phone]- patient was admitted Post op C/S. Patient has  wound infection with cellulitis, pain, and fever/chills.  Admitted for IV antibiotic and wound management.  Case Manager spoke to Dr. Clearance CootsHarper and he stated patient will need some Home Health for wound care  when patient is ready for discharge.  Orders received for Wound , Ostomy Care RN to consult this patient for recommendations for wound care.  Case Manager spoke to patient in room and reviewed demographics and also provided Home Health List for patient. Patient is unsure of choice of HH agency  at this time and CM will follow up with patient.  Case Manager will follow patient and assist with any discharge needs.

## 2013-11-23 NOTE — Discharge Instructions (Signed)
Surgical Site Infection  A surgical site infection can occur after surgery. It happens in the part of the body where the surgery took place. Most patients who have surgery do not develop an infection.   SYMPTOMS  · Redness and pain around the surgical site.  · Drainage of cloudy fluid from the wound.  · Fever.  PREVENTION  Before the procedure:  · Tell your caregiver about any medical problems you may have. Health problems such as allergies, diabetes, and obesity could affect your surgery and treatment.  · Quit smoking. Patients who smoke get more infections. Talk to your caregiver about how you can quit before your surgery.  · Do not shave near the area where you will have surgery. Shaving with a razor can irritate your skin and make it easier to get an infection. Your caregiver may use an electric clipper to remove some of your hair immediately before surgery.  · Ask if you will get antibiotic medicine. In most cases, you should get antibiotics within 60 minutes before surgery. Antibiotics should be stopped within 24 hours after surgery.  · Your caregivers will clean their hands and arms up to their elbows with an antiseptic agent just before the surgery. Your caregivers will also clean their hands with soap and water or an alcohol-based hand rub before and after caring for each patient.  · Your caregivers will wear hair covers, masks, gowns, and gloves during surgery to keep the surgery area clean.  · Your caregivers will clean the skin at the surgery site with a soap that kills germs.  After the procedure:  · Make sure your caregivers clean their hands before examining you. They may use soap and water or an alcohol-based hand rub. If you do not see your caregivers clean their hands, ask them to do so.  · Make sure family and friends who visit you do not touch the surgical wound or dressings.  · Ask family and friends to clean their hands with soap and water or an alcohol-based hand rub before and after visiting  you.  · Make sure you know how to care for your wound before you leave the hospital. Your caregiver will tell you how to take care of your wound.  · Make sure you know whom to contact if you have questions or problems after you get home.  TREATMENT  Most surgical site infections can be treated with antibiotics. Sometimes, patients need another surgery to treat the infection.  HOME CARE INSTRUCTIONS  Always clean your hands before and after caring for your wound.  SEEK IMMEDIATE MEDICAL CARE IF:  You have any symptoms of an infection, such as drainage, fever, or redness and pain at the surgery site.  Document Released: 01/31/2011 Document Revised: 01/28/2012 Document Reviewed: 01/31/2011  ExitCare® Patient Information ©2014 [employer].

## 2013-11-23 NOTE — Progress Notes (Signed)
  ANTIBIOTIC CONSULT NOTE - INITIAL  Pharmacy Consult for Zosyn Indication: Wound infection  Allergies  Allergen Reactions  . Sulfa Antibiotics Hives    Patient Measurements: Height: 5' (152.4 cm) Weight: 174 lb 12 oz (79.266 kg) IBW/kg (Calculated) : 45.5   Vital Signs: Temp: 98.5 F (36.9 C) (01/05 1323) Temp src: Oral (01/05 1323) BP: 160/93 mmHg (01/05 1323) Pulse Rate: 108 (01/05 1323)  Labs:  Recent Labs  11/22/13 1600 11/22/13 2355  WBC 23.0* 23.6*  HGB 6.9* 6.4*  PLT 690* 756*     Microbiology: No results found for this or any previous visit (from the past 720 hour(s)).  I & D culture results pending  Medications:  Ibuprofen 800mg  IV Q6H Dilaudid IV/PO Zofran IV/PO Percocet 5-325mg  1-2 tablets Q3H PRN  Assessment: 18 y.o. female s/p c-section on 11/08/13 now presenting with incisional pain, bleeding, & leukocytosis  Goal of Therapy:  Empiric treament until culture results are available   Plan:  Zosyn 3.375gm IV Q 8 hours F/U on culture results  Denaisha Swango N 11/23/2013,2:21 PM

## 2013-11-23 NOTE — H&P (Signed)
Doris Lowe is an 18 y.o. female. S/P cesarean section 2 weeks ago.  Noted the onset of pain, fever/chills and bleeding from incision several days ago.  [facility] opened and packed incision and patient told to F/U in office today.  The incision is stated as being very painful.  Pertinent Gynecological History: Menses: n/a Bleeding: Normal postpartum Contraception: abstinence DES exposure: denies Blood transfusions: none Sexually transmitted diseases: no past history Previous GYN Procedures: none  Last mammogram: n/a Date: n/a Last pap: n/a Date: n/a OB History: G1, P1   Menstrual History: Menarche age: n/a  Patient's last menstrual period was 02/07/2013.    Past Medical History  Diagnosis Date  . Hypertension   . Pregnancy induced hypertension   . Chlamydia   . Bacteriuria     Past Surgical History  Procedure Laterality Date  . No past surgeries    . Cesarean section N/A 11/08/2013    Procedure: CESAREAN SECTION;  Surgeon: Adam PhenixJames G Arnold, MD;  Location: [facility];  Service: Obstetrics;  Laterality: N/A;  . Cesarean section      Family History  Problem Relation Age of Onset  . Stroke Father   . Cancer Maternal Grandmother     Ovarian  . Cancer Maternal Grandfather     Prostate  . Dementia Paternal Grandmother   . Hypertension Paternal Grandfather     Social History:  reports that she has never smoked. She does not have any smokeless tobacco history on file. She reports that she does not drink alcohol or use illicit drugs.  Allergies:  Allergies  Allergen Reactions  . Sulfa Antibiotics Hives    Prescriptions prior to admission  Medication Sig Dispense Refill  . ibuprofen (ADVIL,MOTRIN) 200 MG tablet Take 600 mg by mouth 2 (two) times daily as needed for mild pain or moderate pain.      . Prenatal Vit-Fe Fumarate-FA (PRENATAL MULTIVITAMIN) TABS tablet Take 1 tablet by mouth daily at 12 noon.      Marland Kitchen. oxyCODONE-acetaminophen (PERCOCET/ROXICET)  5-325 MG per tablet Take 1-2 tablets by mouth every 4 (four) hours as needed for severe pain (moderate - severe pain).  10 tablet  0    Review of Systems  Constitutional: Positive for fever, chills and diaphoresis.  Gastrointestinal: Positive for abdominal pain.    Blood pressure 160/93, pulse 108, temperature 98.5 F (36.9 C), temperature source Oral, resp. rate 17, height 5' (1.524 m), weight 174 lb 12 oz (79.266 kg), last menstrual period 02/07/2013, SpO2 100.00%. Physical Exam  Nursing note and vitals reviewed. Constitutional: She is oriented to person, place, and time. She appears well-developed and well-nourished.  HENT:  Head: Normocephalic and atraumatic.  Eyes: Conjunctivae are normal. Pupils are equal, round, and reactive to light.  Neck: Normal range of motion. Neck supple.  Cardiovascular: Normal rate and regular rhythm.   Respiratory: Effort normal and breath sounds normal.  GI: Soft. Bowel sounds are normal. There is tenderness.  Incision open in center, very indurated above incision, and draining port wine fluid.  Packing soaked.  Musculoskeletal: Normal range of motion.  Neurological: She is alert and oriented to person, place, and time.  Skin: Skin is warm and dry.  Psychiatric: She has a normal mood and affect. Her behavior is normal. Judgment and thought content normal.    Results for orders placed during the hospital encounter of 11/22/13 (from the past 24 hour(s))  CBC     Status: Abnormal   Collection Time  11/22/13 11:55 PM      Result Value Range   WBC 23.6 (*) 4.0 - 10.5 K/uL   RBC 2.36 (*) 3.87 - 5.11 MIL/uL   Hemoglobin 6.4 (*) 12.0 - 15.0 g/dL   HCT 16.1 (*) 09.6 - 04.5 %   MCV 85.6  78.0 - 100.0 fL   MCH 27.1  26.0 - 34.0 pg   MCHC 31.7  30.0 - 36.0 g/dL   RDW 40.9 (*) 81.1 - 91.4 %   Platelets 756 (*) 150 - 400 K/uL    No results found.  Assessment/Plan: Post op C/S.  Wound infection with significant cellulitis, pain, and fever/chills.   She's anemic and has significant leukocytosis ( WBC = 23 k ). Admitted for IV antibiotic and wound management.  Prerna Harold A 11/23/2013, 4:53 PM

## 2013-11-23 NOTE — MAU Note (Signed)
Critical lab value HGB 6.4 rec'd from lab.  H. Hogan CNM notified.

## 2013-11-24 ENCOUNTER — Other Ambulatory Visit (HOSPITAL_COMMUNITY): Payer: Medicaid Other

## 2013-11-24 ENCOUNTER — Inpatient Hospital Stay (HOSPITAL_COMMUNITY): Payer: Medicaid Other

## 2013-11-24 ENCOUNTER — Encounter (HOSPITAL_COMMUNITY): Payer: Self-pay | Admitting: Radiology

## 2013-11-24 MED ORDER — IOHEXOL 300 MG/ML  SOLN
50.0000 mL | INTRAMUSCULAR | Status: AC
  Administered 2013-11-24 (×2): 50 mL via ORAL

## 2013-11-24 MED ORDER — POLYSACCHARIDE IRON COMPLEX 150 MG PO CAPS
150.0000 mg | ORAL_CAPSULE | Freq: Every day | ORAL | Status: DC
  Administered 2013-11-24 – 2013-11-26 (×3): 150 mg via ORAL
  Filled 2013-11-24 (×4): qty 1

## 2013-11-24 MED ORDER — IOHEXOL 300 MG/ML  SOLN
100.0000 mL | Freq: Once | INTRAMUSCULAR | Status: AC | PRN
  Administered 2013-11-24: 100 mL via INTRAVENOUS

## 2013-11-24 NOTE — Consult Note (Signed)
WOC wound consult note Reason for Consult: Dehisced Cesarean section site, 2 weeks post partum.  Wound type: Dehiscence, surgical Measurement: 7 cm x 4 cm x 3 cm with tunneling at 3 and 9 o'clock, extending back 6 cm each.  Wound bed: Beefy red, moderate bloody drainage.  Drainage (amount, consistency, odor) Moderate blood-tinged drainage, no odor. Periwound: Intact Dressing procedure/placement/frequency: Phoned Dr Clearance CootsHarper to notify of CT findings of hematoma of rectus sheath (2.9 cm x 8 cm).  Discussed wound and ordered VAC therapy to promote granulation formation.  Cleansed wound with NS and pat gently dry.  Pack wound with 3 strips of white foam, packing into tunnels at 3 and 9 o'clock and wound bed.  Top with black Granufoam.  Setting 125 mmHg.  Seal obtained without difficulty.  [facility] to assist with VAC dressing changes and monitor healing. Discussed need for increased protein intake to promote healing.  Gave examples such as meat, cheese and dairy products. Patient and her mother verbalized understanding. Next VAC dressing change due on Thursday. WOC team will continue to follow at this time.  Follow up on Thursday if assistance needed with next VAC change.   Maple HudsonKaren Sofia Jaquith RN BSN CWON Pager [phone]

## 2013-11-24 NOTE — Progress Notes (Signed)
UR completed 

## 2013-11-24 NOTE — Progress Notes (Signed)
Post Partum Day 16 Subjective: no complaints.  Less pain.  Objective: Blood pressure 147/72, pulse 103, temperature 98 F (36.7 C), temperature source Oral, resp. rate 16, height 5' (1.524 m), weight 174 lb 12 oz (79.266 kg), SpO2 98.00%.  Physical Exam:  General: alert and no distress Lochia: appropriate Uterine Fundus: firm Incision: dehiscence present with significant cellulitis above incision, less drainage.  Less tender. DVT Evaluation: No evidence of DVT seen on physical exam.   Recent Labs  11/22/13 1600 11/22/13 2355  HGB 6.9* 6.4*  HCT 21.8* 20.2*    Assessment/Plan: Post op wound infection / cellulitis.  Anemia.  Stable.  Continue Zosyn.  CT of abdomen/pelvis today to R/O hematoma.   LOS: 1 day   Doris Lowe A 11/24/2013, 8:45 AM

## 2013-11-25 LAB — CBC
HCT: 21.1 % — ABNORMAL LOW (ref 36.0–46.0)
Hemoglobin: 6.6 g/dL — CL (ref 12.0–15.0)
MCH: 27 pg (ref 26.0–34.0)
MCHC: 31.3 g/dL (ref 30.0–36.0)
MCV: 86.5 fL (ref 78.0–100.0)
Platelets: 811 10*3/uL — ABNORMAL HIGH (ref 150–400)
RBC: 2.44 MIL/uL — ABNORMAL LOW (ref 3.87–5.11)
RDW: 16.2 % — AB (ref 11.5–15.5)
WBC: 10 10*3/uL (ref 4.0–10.5)

## 2013-11-25 LAB — WOUND CULTURE
CULTURE: NO GROWTH
Special Requests: NORMAL

## 2013-11-25 NOTE — Progress Notes (Addendum)
Wound vac continuously beeped with message saying potential blockage detected. Customer service of [employer] was notified and gave instructions to determine if the dressing was causing the machine to dysfunction or if it was the machine and canister itself. [employer] rep Oralia RudRicky Toye was notified and came to the hospital to change the disc dressing on the wound vac and the pump started working properly.

## 2013-11-25 NOTE — Progress Notes (Signed)
Subjective: Postpartum Day 17: Cesarean Delivery Patient reports tolerating PO, + flatus, + BM and no problems voiding.   Less pain.  Objective: Vital signs in last 24 hours: Temp:  [97.6 F (36.4 C)-98.4 F (36.9 C)] 98.4 F (36.9 C) (01/07 0525) Pulse Rate:  [82-95] 87 (01/07 0525) Resp:  [16-18] 16 (01/07 0525) BP: (132-141)/(68-95) 132/83 mmHg (01/07 0525) SpO2:  [99 %-100 %] 99 % (01/07 0525)  Physical Exam:  General: alert and no distress Lochia: appropriate Uterine Fundus: firm Incision: healing well, no significant drainage DVT Evaluation: No evidence of DVT seen on physical exam.   Recent Labs  11/22/13 1600 11/22/13 2355  HGB 6.9* 6.4*  HCT 21.8* 20.2*    Assessment/Plan: Status post Cesarean section.   Rectus muscle hematoma with wound disruption, resolving.  Wound vac in place.  Anticipate discharge tomorrow with continued wound vac management as outpatient. Anemia.  Clinically stable.  Repeat CBS today.  HARPER,CHARLES A 11/25/2013, 8:52 AM

## 2013-11-26 ENCOUNTER — Ambulatory Visit: Payer: Medicaid Other | Admitting: Obstetrics & Gynecology

## 2013-11-26 ENCOUNTER — Inpatient Hospital Stay (HOSPITAL_COMMUNITY): Admission: AD | Admit: 2013-11-26 | Payer: Self-pay | Source: Ambulatory Visit | Admitting: Obstetrics

## 2013-11-26 MED ORDER — IBUPROFEN 800 MG PO TABS: 800.0000 mg | ORAL_TABLET | Freq: Three times a day (TID) | ORAL | Status: DC | PRN

## 2013-11-26 MED ORDER — 4X PROBIOTIC PO TABS: ORAL_TABLET | ORAL | Status: DC

## 2013-11-26 MED ORDER — FUSION PLUS PO CAPS: 1.0000 | ORAL_CAPSULE | Freq: Every day | ORAL | Status: DC

## 2013-11-26 MED ORDER — AMOXICILLIN-POT CLAVULANATE 875-125 MG PO TABS: ORAL_TABLET | ORAL | Status: DC

## 2013-11-26 NOTE — Progress Notes (Signed)
Pt  Ambulated out teaching complete   Wound vac sealed   No leakes noted    Supplies sent home with pt

## 2013-11-26 NOTE — Consult Note (Signed)
WOC wound consult note Reason for Consult: Apply VAC dressing, prepare for discharge with HH.  Wound type: Dehisced surgical site. Measurement: 3 cm x 1 cm.  Tunneling has closed in significantly.  Depth is 3 cm today.  Many clots removed with wound cleansing and VAC alarm has been sounding with occlusion occasionally.  Wound bed: Beefy red and granulating, presence of blood clots in wound bed. Drainage (amount, consistency, odor) Moderate. Blood tinged serous drainage, no odor.  Periwound: Intact.  Dressing procedure/placement/frequency: Wound cleansed with NS and loose clots removed.  Wound is significantly smaller today.  3 pieces white foam packed into wound and topped with black foam.  Difficulty with obtaining seal, pressure applied to pubis to seal drape.  No leak at 125 mmHg.  Home device has been delivered to room, but will not power on.  [facility], another home device to be delivered.  Patient placed back on hospital unit until discharge.  Bedside nurse, Thayer OhmChris present in room and verbalized understanding on switching from hospital to home [employer] unit.  [facility]  Will not follow at this time.  Please re-consult if needed.  Maple HudsonKaren Hamid Brookens RN BSN CWON Pager [phone]

## 2013-11-26 NOTE — Progress Notes (Signed)
Subjective: Postpartum Day 18: Cesarean Delivery Patient reports no complaints.   Objective: Vital signs in last 24 hours: Temp:  [97.5 F (36.4 C)-98.4 F (36.9 C)] 97.5 F (36.4 C) (01/08 0534) Pulse Rate:  [75-89] 75 (01/08 0623) Resp:  [18] 18 (01/08 0623) BP: (135-147)/(84-100) 139/84 mmHg (01/08 0623) SpO2:  [99 %-100 %] 100 % (01/08 40980623)  Physical Exam:  General: alert and no distress Lochia: appropriate Uterine Fundus: firm Incision: healing well.  Wound vac in place. DVT Evaluation: No evidence of DVT seen on physical exam.   Recent Labs  11/25/13 1006  HGB 6.6*  HCT 21.1*    Assessment/Plan: Status post Cesarean section. Postoperative course complicated by wound infection and dehiscence.  Doing well.  Ready for discharge with wound vac manage as outpatient. Keflex Rx.  Iron Rx.  Anemia.  Clinically stable Discharge home with standard precautions and return to clinic in 2 weeks.  Lyman Balingit A 11/26/2013, 8:12 AM

## 2013-11-26 NOTE — Discharge Instructions (Signed)
Hematoma A hematoma is a collection of blood under the skin, in an organ, in a body space, in a joint space, or in other tissue. The blood can clot to form a lump that you can see and feel. The lump is often firm and may sometimes become sore and tender. Most hematomas get better in a few days to weeks. However, some hematomas may be serious and require medical care. Hematomas can range in size from very small to very large. CAUSES  A hematoma can be caused by a blunt or penetrating injury. It can also be caused by spontaneous leakage from a blood vessel under the skin. Spontaneous leakage from a blood vessel is more likely to occur in older people, especially those taking blood thinners. Sometimes, a hematoma can develop after certain medical procedures. SIGNS AND SYMPTOMS   A firm lump on the body.  Possible pain and tenderness in the area.  Bruising.Blue, dark blue, purple-red, or yellowish skin may appear at the site of the hematoma if the hematoma is close to the surface of the skin. For hematomas in deeper tissues or body spaces, the signs and symptoms may be subtle. For example, an intra-abdominal hematoma may cause abdominal pain, weakness, fainting, and shortness of breath. An intracranial hematoma may cause a headache or symptoms such as weakness, trouble speaking, or a change in consciousness. DIAGNOSIS  A hematoma can usually be diagnosed based on your medical history and a physical exam. Imaging tests may be needed if your health care provider suspects a hematoma in deeper tissues or body spaces, such as the abdomen, head, or chest. These tests may include ultrasonography or a CT scan.  TREATMENT  Hematomas usually go away on their own over time. Rarely does the blood need to be drained out of the body. Large hematomas or those that may affect vital organs will sometimes need surgical drainage or monitoring. HOME CARE INSTRUCTIONS   Apply ice to the injured area:   Put ice in a  plastic bag.   Place a towel between your skin and the bag.   Leave the ice on for 20 minutes, 2 3 times a day for the first 1 to 2 days.   After the first 2 days, switch to using warm compresses on the hematoma.   Elevate the injured area to help decrease pain and swelling. Wrapping the area with an elastic bandage may also be helpful. Compression helps to reduce swelling and promotes shrinking of the hematoma. Make sure the bandage is not wrapped too tight.   If your hematoma is on a lower extremity and is painful, crutches may be helpful for a couple days.   Only take over-the-counter or prescription medicines as directed by your health care provider. SEEK IMMEDIATE MEDICAL CARE IF:   You have increasing pain, or your pain is not controlled with medicine.   You have a fever.   You have worsening swelling or discoloration.   Your skin over the hematoma breaks or starts bleeding.   Your hematoma is in your chest or abdomen and you have weakness, shortness of breath, or a change in consciousness.  Your hematoma is on your scalp (caused by a fall or injury) and you have a worsening headache or a change in alertness or consciousness. MAKE SURE YOU:   Understand these instructions.  Will watch your condition.  Will get help right away if you are not doing well or get worse. Document Released: 06/19/2004 Document Revised: 07/08/2013 Document Reviewed:  04/15/2013 ExitCare Patient Information 2014 [place], [place].  Surgical Site Infection A surgical site infection can occur after surgery. It happens in the part of the body where the surgery took place. Most patients who have surgery do not develop an infection.  SYMPTOMS  Redness and pain around the surgical site.  Drainage of cloudy fluid from the wound.  Fever. PREVENTION Before the procedure:  Tell your caregiver about any medical problems you may have. Health problems such as allergies, diabetes, and obesity  could affect your surgery and treatment.  Quit smoking. Patients who smoke get more infections. Talk to your caregiver about how you can quit before your surgery.  Do not shave near the area where you will have surgery. Shaving with a razor can irritate your skin and make it easier to get an infection. Your caregiver may use an electric clipper to remove some of your hair immediately before surgery.  Ask if you will get antibiotic medicine. In most cases, you should get antibiotics within 60 minutes before surgery. Antibiotics should be stopped within 24 hours after surgery.  Your caregivers will clean their hands and arms up to their elbows with an antiseptic agent just before the surgery. Your caregivers will also clean their hands with soap and water or an alcohol-based hand rub before and after caring for each patient.  Your caregivers will wear hair covers, masks, gowns, and gloves during surgery to keep the surgery area clean.  Your caregivers will clean the skin at the surgery site with a soap that kills germs. After the procedure:  Make sure your caregivers clean their hands before examining you. They may use soap and water or an alcohol-based hand rub. If you do not see your caregivers clean their hands, ask them to do so.  Make sure family and friends who visit you do not touch the surgical wound or dressings.  Ask family and friends to clean their hands with soap and water or an alcohol-based hand rub before and after visiting you.  Make sure you know how to care for your wound before you leave the hospital. Your caregiver will tell you how to take care of your wound.  Make sure you know whom to contact if you have questions or problems after you get home. TREATMENT Most surgical site infections can be treated with antibiotics. Sometimes, patients need another surgery to treat the infection. HOME CARE INSTRUCTIONS Always clean your hands before and after caring for your  wound. SEEK IMMEDIATE MEDICAL CARE IF: You have any symptoms of an infection, such as drainage, fever, or redness and pain at the surgery site. Document Released: 01/31/2011 Document Revised: 01/28/2012 Document Reviewed: 01/31/2011 [facility] Patient Information 2014 [place], [place].

## 2013-11-26 NOTE — Discharge Summary (Signed)
Physician Discharge Summary  Patient ID: Doris Lowe MRN: [id number] DOB/AGE: 20/05/1996 20 y.o.  Admit date: 11/23/2013 Discharge date: 11/26/2013  Admission Diagnoses:  Post op wound infection/hematoma  Discharge Diagnoses:  Same Active Problems:   Wound infection following cesarean section, postpartum   Discharged Condition: good  Hospital Course: Admitted with drainage from incision, pain and fever.  Rectus muscle hematoma found on CT scan with no evidence of abscess.  Patient responded well to wound management and IV antibiotics.  Discharged home with Wound Vac [employer] management.  Consults: None  Significant Diagnostic Studies: labs: CBC and nuclear medicine: CT  Treatments: IV hydration, antibiotics: Zosyn, analgesia: Dilaudid and procedures: Wound VAC  Discharge Exam: Blood pressure 139/84, pulse 75, temperature 97.5 F (36.4 C), temperature source Oral, resp. rate 18, height 5' (1.524 m), weight 174 lb 12 oz (79.266 kg), SpO2 100.00%. General appearance: alert and no distress GI: normal findings: soft, non-tender and Wound VAC in place. Extremities: extremities normal, atraumatic, no cyanosis or edema  Disposition: 01-Home or Self Care  Discharge Orders   Future Orders Complete By Expires   Call MD for:  difficulty breathing, headache or visual disturbances  As directed    Call MD for:  extreme fatigue  As directed    Call MD for:  hives  As directed    Call MD for:  persistant dizziness or light-headedness  As directed    Call MD for:  persistant nausea and vomiting  As directed    Call MD for:  redness, tenderness, or signs of infection (pain, swelling, redness, odor or green/yellow discharge around incision site)  As directed    Call MD for:  severe uncontrolled pain  As directed    Call MD for:  temperature >100.4  As directed    Call MD for:  As directed    Diet - low sodium heart healthy  As directed    Discharge instructions  As directed    Comments:     Routine   Discharge wound care:  As directed    Comments:     Per Wound Vac protocol.   Increase activity slowly  As directed        Medication List         4X PROBIOTIC Tabs  Take 1 tablet by mouth once a day.     amoxicillin-clavulanate 875-125 MG per tablet  Commonly known as:  AUGMENTIN  Take 1 tablet po bid with food x 7 days.     FUSION PLUS Caps  Take 1 capsule by mouth daily before breakfast.     ibuprofen 800 MG tablet  Commonly known as:  ADVIL,MOTRIN  Take 1 tablet (800 mg total) by mouth every 8 (eight) hours as needed.     oxyCODONE-acetaminophen 5-325 MG per tablet  Commonly known as:  PERCOCET/ROXICET  Take 1-2 tablets by mouth every 4 (four) hours as needed for severe pain (moderate - severe pain).     prenatal multivitamin Tabs tablet  Take 1 tablet by mouth daily at 12 noon.           Follow-up Information   Follow up with Antionette CharJACKSON-MOORE,LISA A, MD In 2 weeks.   Specialty:  Obstetrics and Gynecology   Contact information:   [address] [place] [address] [phone]       Signed: Brock BadHARPER,CHARLES A 11/26/2013, 8:45 AM

## 2013-11-29 LAB — CULTURE, BLOOD (ROUTINE X 2): CULTURE: NO GROWTH

## 2013-12-09 ENCOUNTER — Encounter: Payer: Self-pay | Admitting: Obstetrics

## 2013-12-10 ENCOUNTER — Ambulatory Visit (INDEPENDENT_AMBULATORY_CARE_PROVIDER_SITE_OTHER): Payer: Medicaid Other | Admitting: Obstetrics

## 2013-12-10 ENCOUNTER — Encounter: Payer: Self-pay | Admitting: Obstetrics

## 2013-12-10 VITALS — BP 152/96 | HR 77 | Temp 97.7°F | Ht 60.0 in | Wt 167.0 lb

## 2013-12-10 DIAGNOSIS — O909 Complication of the puerperium, unspecified: Secondary | ICD-10-CM

## 2013-12-10 NOTE — Progress Notes (Signed)
Subjective:     Doris Lowe is a 18 y.o. female here for a routine exam.  Current complaints: Here for wound check after c-section..  Personal health questionnaire reviewed: yes.   Gynecologic History No LMP recorded. Contraception: abstinence  Obstetric History OB History  Gravida Para Term Preterm AB SAB TAB Ectopic Multiple Living  1 1 1       1     # Outcome Date GA Lbr Len/2nd Weight Sex Delivery Anes PTL Lv  1 TRM 11/08/13 [id number]  7 lb 5.3 oz (3.325 kg) F LTCS Gen  Y       The following portions of the patient's history were reviewed and updated as appropriate: allergies, current medications, past family history, past medical history, past social history, past surgical history and problem list.  Review of Systems Pertinent items are noted in HPI.    Objective:    General appearance: alert and no distress Abdomen: normal findings: soft, non-tender                     Incision C, D and Wound Vac in place.  Assessment:    Postpartum wound infection.  Doing well.   Plan:    Follow up in: 2 weeks.

## 2013-12-21 ENCOUNTER — Encounter: Payer: Self-pay | Admitting: Obstetrics

## 2013-12-23 ENCOUNTER — Ambulatory Visit (INDEPENDENT_AMBULATORY_CARE_PROVIDER_SITE_OTHER): Payer: Medicaid Other | Admitting: Obstetrics & Gynecology

## 2013-12-23 ENCOUNTER — Encounter: Payer: Self-pay | Admitting: Obstetrics & Gynecology

## 2013-12-23 VITALS — BP 146/89 | HR 83 | Temp 98.4°F | Ht 60.0 in | Wt 163.6 lb

## 2013-12-23 DIAGNOSIS — IMO0001 Reserved for inherently not codable concepts without codable children: Secondary | ICD-10-CM

## 2013-12-23 DIAGNOSIS — Z3202 Encounter for pregnancy test, result negative: Secondary | ICD-10-CM

## 2013-12-23 DIAGNOSIS — Z309 Encounter for contraceptive management, unspecified: Secondary | ICD-10-CM

## 2013-12-23 LAB — POCT URINE PREGNANCY: PREG TEST UR: NEGATIVE

## 2013-12-23 NOTE — Patient Instructions (Signed)
Levonorgestrel intrauterine device (IUD) What is this medicine? LEVONORGESTREL IUD (LEE voe nor jes trel) is a contraceptive (birth control) device. The device is placed inside the uterus by a healthcare professional. It is used to prevent pregnancy and can also be used to treat heavy bleeding that occurs during your period. Depending on the device, it can be used for 3 to 5 years. This medicine may be used for other purposes; ask your health care provider or pharmacist if you have questions. COMMON BRAND NAME(S): Mirena, Skyla What should I tell my health care provider before I take this medicine? They need to know if you have any of these conditions: -abnormal Pap smear -cancer of the breast, uterus, or cervix -diabetes -endometritis -genital or pelvic infection now or in the past -have more than one sexual partner or your partner has more than one partner -heart disease -history of an ectopic or tubal pregnancy -immune system problems -IUD in place -liver disease or tumor -problems with blood clots or take blood-thinners -use intravenous drugs -uterus of unusual shape -vaginal bleeding that has not been explained -an unusual or allergic reaction to levonorgestrel, other hormones, silicone, or polyethylene, medicines, foods, dyes, or preservatives -pregnant or trying to get pregnant -breast-feeding How should I use this medicine? This device is placed inside the uterus by a health care professional. Talk to your pediatrician regarding the use of this medicine in children. Special care may be needed. Overdosage: If you think you have taken too much of this medicine contact a poison control center or emergency room at once. NOTE: This medicine is only for you. Do not share this medicine with others. What if I miss a dose? This does not apply. What may interact with this medicine? Do not take this medicine with any of the following  medications: -amprenavir -bosentan -fosamprenavir This medicine may also interact with the following medications: -aprepitant -barbiturate medicines for inducing sleep or treating seizures -bexarotene -griseofulvin -medicines to treat seizures like carbamazepine, ethotoin, felbamate, oxcarbazepine, phenytoin, topiramate -modafinil -pioglitazone -rifabutin -rifampin -rifapentine -some medicines to treat HIV infection like atazanavir, indinavir, lopinavir, nelfinavir, tipranavir, ritonavir -St. John's wort -warfarin This list may not describe all possible interactions. Give your health care provider a list of all the medicines, herbs, non-prescription drugs, or dietary supplements you use. Also tell them if you smoke, drink alcohol, or use illegal drugs. Some items may interact with your medicine. What should I watch for while using this medicine? Visit your doctor or health care professional for regular check ups. See your doctor if you or your partner has sexual contact with others, becomes HIV positive, or gets a sexual transmitted disease. This product does not protect you against HIV infection (AIDS) or other sexually transmitted diseases. You can check the placement of the IUD yourself by reaching up to the top of your vagina with clean fingers to feel the threads. Do not pull on the threads. It is a good habit to check placement after each menstrual period. Call your doctor right away if you feel more of the IUD than just the threads or if you cannot feel the threads at all. The IUD may come out by itself. You may become pregnant if the device comes out. If you notice that the IUD has come out use a backup birth control method like condoms and call your health care provider. Using tampons will not change the position of the IUD and are okay to use during your period. What side effects may I   notice from receiving this medicine? Side effects that you should report to your doctor or  health care professional as soon as possible: -allergic reactions like skin rash, itching or hives, swelling of the face, lips, or tongue -fever, flu-like symptoms -genital sores -high blood pressure -no menstrual period for 6 weeks during use -pain, swelling, warmth in the leg -pelvic pain or tenderness -severe or sudden headache -signs of pregnancy -stomach cramping -sudden shortness of breath -trouble with balance, talking, or walking -unusual vaginal bleeding, discharge -yellowing of the eyes or skin Side effects that usually do not require medical attention (report to your doctor or health care professional if they continue or are bothersome): -acne -breast pain -change in sex drive or performance -changes in weight -cramping, dizziness, or faintness while the device is being inserted -headache -irregular menstrual bleeding within first 3 to 6 months of use -nausea This list may not describe all possible side effects. Call your doctor for medical advice about side effects. You may report side effects to FDA at 1-800-FDA-1088. Where should I keep my medicine? This does not apply. NOTE: This sheet is a summary. It may not cover all possible information. If you have questions about this medicine, talk to your doctor, pharmacist, or health care provider.  2014, [employer]vier/Gold Standard. (2011-12-06 13:54:04)  

## 2013-12-23 NOTE — Progress Notes (Signed)
Subjective:     Doris Lowe is a 18 y.o. female who presents for a postpartum visit. She is 6 weeks postpartum following a low cervical transverse Cesarean section. I have fully reviewed the prenatal and intrapartum course. The delivery was at 37 gestational weeks. Outcome: primary cesarean section, low transverse incision. Anesthesia: general. Postpartum course has been normal. Baby's course has been normal;. Baby is feeding by bottle Rush Barer- Gerber. Bleeding bleeding had stopped- patient has started a cycle with moderate flow.. Bowel function is normal. Bladder function is normal. Patient is not sexually active. Contraception method is abstinence. Postpartum depression screening: negative. Patient is interested in discussing birth control. Patient states her incision is about closed- still doing dressing changes daily.Patient wants to defer her pelvic exam until her incision is closed completely.  The following portions of the patient's history were reviewed and updated as appropriate: allergies, current medications, past family history, past medical history, past social history, past surgical history and problem list.  Review of Systems Pertinent items are noted in HPI.   Objective:    BP 146/89  Pulse 83  Temp(Src) 98.4 F (36.9 C)  Ht 5' (1.524 m)  Wt 163 lb 9.6 oz (74.208 kg)  BMI 31.95 kg/m2  LMP 12/20/2013  Breastfeeding? No       Abd: Incision with small area of granulation tissue; silver nitrate applied Assessment:   C/D wound separation/superificial--healing  Plan:     Follow up in: 2 weeks or as needed.  Mirena IUD insertion at next visit

## 2014-01-06 ENCOUNTER — Ambulatory Visit: Payer: Medicaid Other | Admitting: Obstetrics & Gynecology

## 2014-01-13 ENCOUNTER — Ambulatory Visit: Payer: Medicaid Other | Admitting: Obstetrics & Gynecology

## 2014-01-20 ENCOUNTER — Ambulatory Visit (INDEPENDENT_AMBULATORY_CARE_PROVIDER_SITE_OTHER): Payer: Medicaid Other | Admitting: Obstetrics & Gynecology

## 2014-01-20 ENCOUNTER — Encounter: Payer: Self-pay | Admitting: Obstetrics & Gynecology

## 2014-01-20 VITALS — BP 132/78 | HR 90 | Temp 98.2°F | Wt 166.0 lb

## 2014-01-20 DIAGNOSIS — Z30430 Encounter for insertion of intrauterine contraceptive device: Secondary | ICD-10-CM

## 2014-01-20 DIAGNOSIS — Z3202 Encounter for pregnancy test, result negative: Secondary | ICD-10-CM

## 2014-01-20 DIAGNOSIS — Z01812 Encounter for preprocedural laboratory examination: Secondary | ICD-10-CM

## 2014-01-20 LAB — POCT URINE PREGNANCY: Preg Test, Ur: NEGATIVE

## 2014-01-20 MED ORDER — LEVONORGESTREL 20 MCG/24HR IU IUD
INTRAUTERINE_SYSTEM | Freq: Once | INTRAUTERINE | Status: AC
  Administered 2014-01-20: 15:00:00 via INTRAUTERINE

## 2014-01-20 NOTE — Progress Notes (Signed)
Subjective:     Doris Lowe is a 18 y.o. female who presents for a postpartum visit. She is 8 weeks postpartum following a low cervical transverse Cesarean section. I have fully reviewed the prenatal and intrapartum course. The delivery was at 37 gestational weeks. Outcome: primary cesarean section, low transverse incision. Anesthesia: general. Postpartum course has been going well. Baby's course has been going well. Baby is feeding by bottle - [employer]-soothe. Bleeding pt recently had first cycle after delivery. Bowel function is normal. Bladder function is normal. Patient is not sexually active. Contraception method is none. Postpartum depression screening: negative. Pt plans to have IUD placed at todays visit. The following portions of the patient's history were reviewed and updated as appropriate: allergies, current medications, past family history, past medical history, past social history, past surgical history and problem list.  Review of Systems Pertinent items are noted in HPI.   Objective:    BP 162/80  Pulse 90  Temp(Src) 98.2 F (36.8 C)  Wt 166 lb (75.297 kg)  LMP 01/16/2014  Breastfeeding? No        General:  alert     Abdomen: soft, non-tender; bowel sounds normal; no masses,  no organomegaly: incision well-healed   Vulva:  normal  Vagina: normal vagina  Cervix:  no lesions  Corpus: normal size, contour, position, consistency, mobility, non-tender  Adnexa:  normal adnexa   Assessment:     Normal postpartum exam.   Plan:    Mirena IUD insertion today  Follow up in: 6 weeks or as needed.      IUD Insertion Procedure Note  Pre-operative Diagnosis: Desires contraception  Post-operative Diagnosis: same  Indications: contraception  Procedure Details  Urine pregnancy test was done  and result was negative.  The risks (including infection, bleeding, pain, and uterine perforation) and benefits of the procedure were explained to the patient and  Written informed consent was obtained.    Cervix cleansed with Betadine. Uterus sounded to 7 cm. IUD inserted without difficulty. String visible and trimmed. Patient tolerated procedure well.  IUD Information: Mirena, Lot # [id number], Expiration date 08/17.  Condition: Stable  Complications: None  Plan:  The patient was advised to call for any fever or for prolonged or severe pain or bleeding. She was advised to use OTC analgesics as needed for mild to moderate pain.

## 2014-01-24 ENCOUNTER — Encounter: Payer: Self-pay | Admitting: Obstetrics & Gynecology

## 2014-01-24 NOTE — Patient Instructions (Signed)

## 2014-03-04 ENCOUNTER — Ambulatory Visit: Payer: Medicaid Other | Admitting: Obstetrics & Gynecology

## 2014-03-11 ENCOUNTER — Ambulatory Visit (INDEPENDENT_AMBULATORY_CARE_PROVIDER_SITE_OTHER): Payer: Medicaid Other | Admitting: Obstetrics & Gynecology

## 2014-03-11 ENCOUNTER — Encounter: Payer: Self-pay | Admitting: Obstetrics & Gynecology

## 2014-03-11 VITALS — BP 131/83 | HR 91 | Temp 98.8°F | Ht 60.0 in | Wt 170.0 lb

## 2014-03-11 DIAGNOSIS — Z30431 Encounter for routine checking of intrauterine contraceptive device: Secondary | ICD-10-CM

## 2014-03-11 NOTE — Progress Notes (Signed)
Patient ID: Doris Lowe, female   DOB: 07/15/1996, 18 y.o.   MRN: [id number]  Chief Complaint  Patient presents with  . Follow-up    Mirena    HPI Doris Lowe is a 18 y.o. female.  Presents s/p Mirena IUD insertion.  HPI  Past Medical History  Diagnosis Date  . Hypertension   . Pregnancy induced hypertension   . Chlamydia   . Bacteriuria     Past Surgical History  Procedure Laterality Date  . No past surgeries    . Cesarean section N/A 11/08/2013    Procedure: CESAREAN SECTION;  Surgeon: Adam PhenixJames G Arnold, MD;  Location: [facility];  Service: Obstetrics;  Laterality: N/A;  . Cesarean section      Family History  Problem Relation Age of Onset  . Stroke Father   . Cancer Maternal Grandmother     Ovarian  . Cancer Maternal Grandfather     Prostate  . Dementia Paternal Grandmother   . Hypertension Paternal Grandfather     Social History History  Substance Use Topics  . Smoking status: Never Smoker   . Smokeless tobacco: Not on file  . Alcohol Use: No    Allergies  Allergen Reactions  . Sulfa Antibiotics Hives    No current outpatient prescriptions on file.   No current facility-administered medications for this visit.    Review of Systems Review of Systems Constitutional: negative for fatigue and weight loss Respiratory: negative for cough and wheezing Cardiovascular: negative for chest pain, fatigue and palpitations Gastrointestinal: negative for abdominal pain and change in bowel habits Genitourinary:negative Integument/breast: negative for nipple discharge Musculoskeletal:negative for myalgias Neurological: negative for gait problems and tremors Behavioral/Psych: negative for abusive relationship, depression Endocrine: negative for temperature intolerance     Blood pressure 131/83, pulse 91, temperature 98.8 F (37.1 C), height 5' (1.524 m), weight 77.111 kg (170 lb), not currently breastfeeding.  Physical Exam Physical Exam General:   alert  Skin:    no rash or abnormalities  Lungs:   clear to auscultation bilaterally  Heart:   regular rate and rhythm, S1, S2 normal, no murmur, click, rub or gallop  Breasts:   normal without suspicious masses, skin or nipple changes or axillary nodes  Abdomen:  normal findings: no organomegaly, soft, non-tender and no hernia  Pelvis:  External genitalia: normal general appearance Urinary system: urethral meatus normal and bladder without fullness, nontender Vaginal: normal without tenderness, induration or masses Cervix: normal appearance, IUD strings seen Adnexa: normal bimanual exam Uterus: anteverted and non-tender, normal size   Data Reviewed None  Assessment    Normal IUD check    Plan    Follow up as needed.         Antionette CharLisa Jackson-Moore 03/11/2014, 4:05 PM

## 2014-03-14 ENCOUNTER — Encounter: Payer: Self-pay | Admitting: Obstetrics & Gynecology

## 2014-05-27 ENCOUNTER — Encounter: Payer: Self-pay | Admitting: Obstetrics & Gynecology

## 2014-05-27 ENCOUNTER — Ambulatory Visit (INDEPENDENT_AMBULATORY_CARE_PROVIDER_SITE_OTHER): Payer: Medicaid Other | Admitting: Obstetrics & Gynecology

## 2014-05-27 VITALS — BP 133/84 | HR 97 | Temp 97.3°F | Ht 60.0 in | Wt 179.0 lb

## 2014-05-27 DIAGNOSIS — R102 Pelvic and perineal pain: Secondary | ICD-10-CM

## 2014-05-27 DIAGNOSIS — Z3202 Encounter for pregnancy test, result negative: Secondary | ICD-10-CM

## 2014-05-27 DIAGNOSIS — Z3009 Encounter for other general counseling and advice on contraception: Secondary | ICD-10-CM

## 2014-05-27 DIAGNOSIS — Z30432 Encounter for removal of intrauterine contraceptive device: Secondary | ICD-10-CM

## 2014-05-27 DIAGNOSIS — N949 Unspecified condition associated with female genital organs and menstrual cycle: Secondary | ICD-10-CM

## 2014-05-27 DIAGNOSIS — Z30013 Encounter for initial prescription of injectable contraceptive: Secondary | ICD-10-CM

## 2014-05-27 LAB — POCT URINE PREGNANCY: PREG TEST UR: NEGATIVE

## 2014-05-27 MED ORDER — MEDROXYPROGESTERONE ACETATE 150 MG/ML IM SUSP: 150.0000 mg | INTRAMUSCULAR | Status: DC

## 2014-05-28 LAB — GC/CHLAMYDIA PROBE AMP
CT Probe RNA: NEGATIVE
GC Probe RNA: NEGATIVE

## 2014-05-28 NOTE — Progress Notes (Signed)
Patient ID: Doris Lowe, female   DOB: 09/28/1996, 18 y.o.   MRN: [id number]  Chief Complaint  Patient presents with  . Follow-up    IUD Check    HPI Doris Lowe is Lowe 18 y.o. female.  C/O cramping.  HPI  Past Medical History  Diagnosis Date  . Hypertension   . Pregnancy induced hypertension   . Chlamydia   . Bacteriuria     Past Surgical History  Procedure Laterality Date  . No past surgeries    . Cesarean section N/Lowe 11/08/2013    Procedure: CESAREAN SECTION;  Surgeon: Adam PhenixJames G Arnold, MD;  Location: [facility];  Service: Obstetrics;  Laterality: N/Lowe;  . Cesarean section      Family History  Problem Relation Age of Onset  . Stroke Father   . Cancer Maternal Grandmother     Ovarian  . Cancer Maternal Grandfather     Prostate  . Dementia Paternal Grandmother   . Hypertension Paternal Grandfather     Social History History  Substance Use Topics  . Smoking status: Never Smoker   . Smokeless tobacco: Not on file  . Alcohol Use: No    Allergies  Allergen Reactions  . Sulfa Antibiotics Hives    Current Outpatient Prescriptions  Medication Sig Dispense Refill  . medroxyPROGESTERone (DEPO-PROVERA) 150 MG/ML injection Inject 1 mL (150 mg total) into the muscle every 3 (three) months.  1 mL  0   No current facility-administered medications for this visit.    Review of Systems Review of Systems Constitutional: negative for fatigue and weight loss Respiratory: negative for cough and wheezing Cardiovascular: negative for chest pain, fatigue and palpitations Gastrointestinal: negative for abdominal pain and change in bowel habits Genitourinary: positive for pelvic pain Integument/breast: negative for nipple discharge Musculoskeletal:negative for myalgias Neurological: negative for gait problems and tremors Behavioral/Psych: negative for abusive relationship, depression Endocrine: negative for temperature intolerance     Blood pressure 133/84, pulse 97,  temperature 97.3 F (36.3 C), height 5' (1.524 m), weight 81.194 kg (179 lb), not currently breastfeeding.  Physical Exam Physical Exam General:   alert  Skin:   no rash or abnormalities  Lungs:   clear to auscultation bilaterally  Heart:   regular rate and rhythm, S1, S2 normal, no murmur, click, rub or gallop  Breasts:   normal without suspicious masses, skin or nipple changes or axillary nodes  Abdomen:  normal findings: no organomegaly, soft, non-tender and no hernia  Pelvis:  External genitalia: normal general appearance Urinary system: urethral meatus normal and bladder without fullness, nontender Vaginal: normal without tenderness, induration or masses Cervix: normal appearance Adnexa: normal bimanual exam Uterus: anteverted and non-tender, normal size; IUD removed intact    U/S w/3-D imaging cannot r/o myometrial penetration; normal ovaries  Data Reviewed UPT/U/S  Assessment    IUD in place, pelvic pain, cannot r/o myometrial penetration-->removal     Plan    Orders Placed This Encounter  Procedures  . GC/Chlamydia Probe Amp  . Urine culture  . US Transvaginal Non-OB    Standing Status: Future     Number of Occurrences:      Standing Expiration Date: 07/29/2015    Order Specific Question:  Reason for Exam (SYMPTOM  OR DIAGNOSIS REQUIRED)    Answer:  iud check    Order Specific Question:  Preferred imaging location?    Answer:  Internal  . POCT urine pregnancy   Meds ordered this encounter  Medications  . medroxyPROGESTERone (DEPO-PROVERA) 150  MG/ML injection    Sig: Inject 1 mL (150 mg total) into the muscle every 3 (three) months.    Dispense:  1 mL    Refill:  0    Need to obtain previous records Possible alternate contraceptive options reviewed. Follow up as needed.         JACKSON-MOORE,Doris Lowe 05/28/2014, 9:22 PM

## 2014-05-28 NOTE — Patient Instructions (Signed)
Contraception Choices Contraception (birth control) is the use of any methods or devices to prevent pregnancy. Below are some methods to help avoid pregnancy. HORMONAL METHODS   Contraceptive implant. This is a thin, plastic tube containing progesterone hormone. It does not contain estrogen hormone. Your health care provider inserts the tube in the inner part of the upper arm. The tube can remain in place for up to 3 years. After 3 years, the implant must be removed. The implant prevents the ovaries from releasing an egg (ovulation), thickens the cervical mucus to prevent sperm from entering the uterus, and thins the lining of the inside of the uterus.  Progesterone-only injections. These injections are given every 3 months by your health care provider to prevent pregnancy. This synthetic progesterone hormone stops the ovaries from releasing eggs. It also thickens cervical mucus and changes the uterine lining. This makes it harder for sperm to survive in the uterus.  Birth control pills. These pills contain estrogen and progesterone hormone. They work by preventing the ovaries from releasing eggs (ovulation). They also cause the cervical mucus to thicken, preventing the sperm from entering the uterus. Birth control pills are prescribed by a health care provider.Birth control pills can also be used to treat heavy periods.  Minipill. This type of birth control pill contains only the progesterone hormone. They are taken every day of each month and must be prescribed by your health care provider.  Birth control patch. The patch contains hormones similar to those in birth control pills. It must be changed once a week and is prescribed by a health care provider.  Vaginal ring. The ring contains hormones similar to those in birth control pills. It is left in the vagina for 3 weeks, removed for 1 week, and then a new one is put back in place. The patient must be comfortable inserting and removing the ring  from the vagina.A health care provider's prescription is necessary.  Emergency contraception. Emergency contraceptives prevent pregnancy after unprotected sexual intercourse. This pill can be taken right after sex or up to 5 days after unprotected sex. It is most effective the sooner you take the pills after having sexual intercourse. Most emergency contraceptive pills are available without a prescription. Check with your pharmacist. Do not use emergency contraception as your only form of birth control. BARRIER METHODS   Female condom. This is a thin sheath (latex or rubber) that is worn over the penis during sexual intercourse. It can be used with spermicide to increase effectiveness.  Female condom. This is a soft, loose-fitting sheath that is put into the vagina before sexual intercourse.  Diaphragm. This is a soft, latex, dome-shaped barrier that must be fitted by a health care provider. It is inserted into the vagina, along with a spermicidal jelly. It is inserted before intercourse. The diaphragm should be left in the vagina for 6 to 8 hours after intercourse.  Cervical cap. This is a round, soft, latex or plastic cup that fits over the cervix and must be fitted by a health care provider. The cap can be left in place for up to 48 hours after intercourse.  Sponge. This is a soft, circular piece of polyurethane foam. The sponge has spermicide in it. It is inserted into the vagina after wetting it and before sexual intercourse.  Spermicides. These are chemicals that kill or block sperm from entering the cervix and uterus. They come in the form of creams, jellies, suppositories, foam, or tablets. They do not require a   prescription. They are inserted into the vagina with an applicator before having sexual intercourse. The process must be repeated every time you have sexual intercourse. INTRAUTERINE CONTRACEPTION  Intrauterine device (IUD). This is a T-shaped device that is put in a woman's uterus  during a menstrual period to prevent pregnancy. There are 2 types:  Copper IUD. This type of IUD is wrapped in copper wire and is placed inside the uterus. Copper makes the uterus and fallopian tubes produce a fluid that kills sperm. It can stay in place for 10 years.  Hormone IUD. This type of IUD contains the hormone progestin (synthetic progesterone). The hormone thickens the cervical mucus and prevents sperm from entering the uterus, and it also thins the uterine lining to prevent implantation of a fertilized egg. The hormone can weaken or kill the sperm that get into the uterus. It can stay in place for 3-5 years, depending on which type of IUD is used. PERMANENT METHODS OF CONTRACEPTION  Female tubal ligation. This is when the woman's fallopian tubes are surgically sealed, tied, or blocked to prevent the egg from traveling to the uterus.  Hysteroscopic sterilization. This involves placing a small coil or insert into each fallopian tube. Your doctor uses a technique called hysteroscopy to do the procedure. The device causes scar tissue to form. This results in permanent blockage of the fallopian tubes, so the sperm cannot fertilize the egg. It takes about 3 months after the procedure for the tubes to become blocked. You must use another form of birth control for these 3 months.  Female sterilization. This is when the female has the tubes that carry sperm tied off (vasectomy).This blocks sperm from entering the vagina during sexual intercourse. After the procedure, the man can still ejaculate fluid (semen). NATURAL PLANNING METHODS  Natural family planning. This is not having sexual intercourse or using a barrier method (condom, diaphragm, cervical cap) on days the woman could become pregnant.  Calendar method. This is keeping track of the length of each menstrual cycle and identifying when you are fertile.  Ovulation method. This is avoiding sexual intercourse during ovulation.  Symptothermal  method. This is avoiding sexual intercourse during ovulation, using a thermometer and ovulation symptoms.  Post-ovulation method. This is timing sexual intercourse after you have ovulated. Regardless of which type or method of contraception you choose, it is important that you use condoms to protect against the transmission of sexually transmitted infections (STIs). Talk with your health care provider about which form of contraception is most appropriate for you. Document Released: 11/05/2005 Document Revised: 11/10/2013 Document Reviewed: 04/30/2013 [employer] Patient Information 2015 [employer]. This information is not intended to replace advice given to you by your health care provider. Make sure you discuss any questions you have with your health care provider.  

## 2014-05-31 LAB — URINE CULTURE

## 2014-06-10 ENCOUNTER — Ambulatory Visit: Payer: Medicaid Other

## 2014-06-18 ENCOUNTER — Other Ambulatory Visit: Payer: Self-pay | Admitting: *Deleted

## 2014-06-18 DIAGNOSIS — N390 Urinary tract infection, site not specified: Secondary | ICD-10-CM

## 2014-06-18 MED ORDER — NITROFURANTOIN MONOHYD MACRO 100 MG PO CAPS: 100.0000 mg | ORAL_CAPSULE | Freq: Two times a day (BID) | ORAL | Status: DC

## 2014-09-20 ENCOUNTER — Encounter: Payer: Self-pay | Admitting: Obstetrics & Gynecology

## 2014-11-15 ENCOUNTER — Encounter: Payer: Self-pay | Admitting: *Deleted

## 2014-11-16 ENCOUNTER — Encounter: Payer: Self-pay | Admitting: Obstetrics & Gynecology

## 2017-02-05 DIAGNOSIS — E559 Vitamin D deficiency, unspecified: Secondary | ICD-10-CM | POA: Insufficient documentation

## 2017-06-24 ENCOUNTER — Other Ambulatory Visit: Payer: Self-pay | Admitting: Certified Nurse Midwife

## 2017-06-24 ENCOUNTER — Encounter: Payer: Self-pay | Admitting: Certified Nurse Midwife

## 2017-06-24 ENCOUNTER — Other Ambulatory Visit: Payer: Self-pay

## 2017-06-24 ENCOUNTER — Ambulatory Visit (INDEPENDENT_AMBULATORY_CARE_PROVIDER_SITE_OTHER): Admitting: Certified Nurse Midwife

## 2017-06-24 ENCOUNTER — Ambulatory Visit (INDEPENDENT_AMBULATORY_CARE_PROVIDER_SITE_OTHER)

## 2017-06-24 VITALS — BP 145/93 | HR 102 | Ht 60.0 in | Wt 184.6 lb

## 2017-06-24 DIAGNOSIS — N912 Amenorrhea, unspecified: Secondary | ICD-10-CM | POA: Diagnosis not present

## 2017-06-24 DIAGNOSIS — O209 Hemorrhage in early pregnancy, unspecified: Secondary | ICD-10-CM | POA: Diagnosis not present

## 2017-06-24 LAB — POCT URINE PREGNANCY: Preg Test, Ur: POSITIVE — AB

## 2017-06-24 NOTE — Patient Instructions (Signed)

## 2017-06-24 NOTE — Progress Notes (Signed)
Subjective:    Doris Lowe is a 21 y.o. female who presents for evaluation of amenorrhea. She believes she could be pregnant. Pregnancy is desired. Sexual Activity: single partner, contraception: none. Current symptoms also include: breast tenderness. Last period was normal.   Patient's last menstrual period was 05/10/2017 (exact date). The following portions of the patient's history were reviewed and updated as appropriate:   History of C/S @ 37 wks for fetal distress (nuchal cord) . PT states that it was a low transverse incision. Pt instructed to sign release of medical records to confirm incision.   Review of Systems Pertinent items are noted in HPI.     Objective:    BP (!) 145/93   Pulse (!) 102   Ht 5' (1.524 m)   Wt 184 lb 9.6 oz (83.7 kg)   LMP 05/10/2017 (Exact Date)   BMI 36.05 kg/m  General: alert, cooperative, appears stated age and no acute distress    Lab Review Urine HCG: positive    Assessment:    Absence of menstruation.     Plan:    Pregnancy Test: Positive: EDC: 02/14/18. Briefly discussed pre-natal care options. Pregnancy, Childbirth and the Newborn book given. Encouraged well-balanced diet, plenty of rest when needed, pre-natal vitamins daily and walking for exercise. Discussed self-help for nausea, avoiding OTC medications until consulting provider or pharmacist, other than Tylenol as needed, minimal caffeine (1-2 cups daily) and avoiding alcohol. She will schedule her initial OB visit in the next month with her PCP or OB provider. Feel free to call with any questions. U/s asap for viability due to bleeding/spotting with clots for the past few days    Doreene BurkeAnnie Nadalyn Deringer, CNM

## 2017-06-25 ENCOUNTER — Telehealth: Payer: Self-pay | Admitting: Certified Nurse Midwife

## 2017-06-25 LAB — BETA HCG QUANT (REF LAB): hCG Quant: 208 m[IU]/mL

## 2017-06-25 NOTE — Telephone Encounter (Signed)
Release was received from us for records per call from [facility] - [facility]

## 2017-06-26 ENCOUNTER — Other Ambulatory Visit

## 2017-06-26 DIAGNOSIS — O209 Hemorrhage in early pregnancy, unspecified: Secondary | ICD-10-CM

## 2017-06-27 LAB — BETA HCG QUANT (REF LAB): hCG Quant: 67 m[IU]/mL

## 2017-07-01 ENCOUNTER — Encounter: Payer: Self-pay | Admitting: Certified Nurse Midwife

## 2017-07-01 ENCOUNTER — Other Ambulatory Visit: Payer: Self-pay | Admitting: Certified Nurse Midwife

## 2017-07-01 DIAGNOSIS — O021 Missed abortion: Secondary | ICD-10-CM

## 2017-07-23 ENCOUNTER — Encounter

## 2017-08-06 ENCOUNTER — Encounter: Admitting: Certified Nurse Midwife

## 2020-06-19 DIAGNOSIS — D509 Iron deficiency anemia, unspecified: Secondary | ICD-10-CM | POA: Insufficient documentation

## 2020-06-19 NOTE — Progress Notes (Signed)
[facility]  [phone] [phone]  ID: Doris Lowe OB: Feb 26, 1996  MR#: [id number]  AOZ#:[id number]  Patient Care Team: Patrice Paradise, MD as PCP - General (Physician Assistant) Patient, No Pcp Per (General Practice)  CHIEF COMPLAINT: Iron deficiency anemia.  INTERVAL HISTORY: Patient is a 24 year old female who was noted to have chronic iron deficiency anemia.  She reports she cannot tolerate oral iron supplementation.  She has weakness and fatigue, but otherwise feels well.  She has no neurologic complaints.  She denies any recent fevers or illnesses.  She has a good appetite and denies weight loss.  She has no chest pain, shortness of breath, cough, or hemoptysis.  She denies any nausea, vomiting, constipation, or diarrhea.  She has no melena or hematochezia.  She has no urinary complaints.  Patient otherwise feels well and offers no further specific complaints today.  REVIEW OF SYSTEMS:   Review of Systems  Constitutional: Positive for malaise/fatigue. Negative for fever and weight loss.  Respiratory: Negative.  Negative for cough, hemoptysis and shortness of breath.   Cardiovascular: Negative.  Negative for chest pain and leg swelling.  Gastrointestinal: Negative.  Negative for abdominal pain, blood in stool and melena.  Genitourinary: Negative.  Negative for hematuria.  Musculoskeletal: Negative.  Negative for back pain.  Skin: Negative.  Negative for rash.  Neurological: Positive for weakness. Negative for dizziness, focal weakness and headaches.  Psychiatric/Behavioral: Negative.  The patient is not nervous/anxious.     As per HPI. Otherwise, a complete review of systems is negative.  PAST MEDICAL HISTORY: Past Medical History:  Diagnosis Date  . Bacteriuria   . Chlamydia   . Hypertension   . Pregnancy induced hypertension     PAST SURGICAL HISTORY: Past Surgical History:  Procedure Laterality Date  . CESAREAN SECTION N/A  11/08/2013   Procedure: CESAREAN SECTION;  Surgeon: Adam Phenix, MD;  Location: [facility];  Service: Obstetrics;  Laterality: N/A;  . CESAREAN SECTION      FAMILY HISTORY: Family History  Problem Relation Age of Onset  . Stroke Father   . Cancer Maternal Grandmother        Ovarian  . Ovarian cancer Maternal Grandmother   . Cancer Maternal Grandfather        Prostate  . Dementia Paternal Grandmother   . Hypertension Paternal Grandfather   . Breast cancer Neg Hx   . Colon cancer Neg Hx   . Diabetes Neg Hx   . Heart disease Neg Hx     ADVANCED DIRECTIVES (Y/N):  N  HEALTH MAINTENANCE: Social History   Tobacco Use  . Smoking status: Never Smoker  . Smokeless tobacco: Never Used  Vaping Use  . Vaping Use: Never used  Substance Use Topics  . Alcohol use: Not Currently  . Drug use: Never     Colonoscopy:  PAP:  Bone density:  Lipid panel:  Allergies  Allergen Reactions  . Sulfa Antibiotics Hives    Current Outpatient Medications  Medication Sig Dispense Refill  . cyanocobalamin (,VITAMIN B-12,) 1000 MCG/ML injection Inject 1 mL into muscle every 14 days for 3 months, then once a month for 3 months.    . Prenatal Vit-Fe Fumarate-FA (PRENATAL MULTIVITAMIN) TABS tablet Take 1 tablet by mouth daily at 12 noon.     No current facility-administered medications for this visit.    OBJECTIVE: Vitals:   06/20/20 1509  BP: (!) 134/93  Pulse: 96  Resp: 20  Temp: (!) 96.8 F (  36 C)  SpO2: 100%     Body mass index is 34.51 kg/m.    ECOG FS:0 - Asymptomatic  General: Well-developed, well-nourished, no acute distress. Eyes: Pink conjunctiva, anicteric sclera. HEENT: Normocephalic, moist mucous membranes. Lungs: No audible wheezing or coughing. Heart: Regular rate and rhythm. Abdomen: Soft, nontender, no obvious distention. Musculoskeletal: No edema, cyanosis, or clubbing. Neuro: Alert, answering all questions appropriately. Cranial nerves grossly intact. Skin: No  rashes or petechiae noted. Psych: Normal affect. Lymphatics: No cervical, calvicular, axillary or inguinal LAD.   LAB RESULTS:  Lab Results  Component Value Date   CREATININE 0.51 11/02/2013   AST 21 11/02/2013   ALT 13 11/02/2013    Lab Results  Component Value Date   WBC 10.0 11/25/2013   NEUTROABS 11.7 (H) 05/11/2013   HGB 6.6 (LL) 11/25/2013   HCT 21.1 (L) 11/25/2013   MCV 86.5 11/25/2013   PLT 811 (H) 11/25/2013     STUDIES: No results found.  ASSESSMENT: Iron deficiency anemia.  PLAN:    1. Iron deficiency anemia: Patient's most recent hemoglobin was reported at 8.9.  She has a ferritin of 5, and iron saturation of 3%, and a total iron of 16.  Patient reports she cannot tolerate oral iron supplementation, therefore will return to clinic in 1 and 2 weeks to receive 510 mg IV Feraheme.  Patient will then return to clinic in 3 months with repeat laboratory work, further evaluation, and continuation of treatment if needed. 2.  B12 deficiency: Patient's most recent B12 levels were 172.  She received 1000 mcg intramuscular B12 along with her Feraheme as above.  I spent a total of 45 minutes reviewing chart data, face-to-face evaluation with the patient, counseling and coordination of care as detailed above.   Patient expressed understanding and was in agreement with this plan. She also understands that She can call clinic at any time with any questions, concerns, or complaints.    Jeralyn Ruths, MD   06/21/2020 7:13 AM

## 2020-06-20 ENCOUNTER — Encounter: Payer: Self-pay | Admitting: Certified Nurse Midwife

## 2020-06-20 ENCOUNTER — Inpatient Hospital Stay: Payer: BC Managed Care – PPO | Attending: Oncology | Admitting: Oncology

## 2020-06-20 ENCOUNTER — Inpatient Hospital Stay: Payer: BC Managed Care – PPO

## 2020-06-20 ENCOUNTER — Other Ambulatory Visit: Payer: Self-pay

## 2020-06-20 ENCOUNTER — Encounter: Payer: Self-pay | Admitting: Oncology

## 2020-06-20 DIAGNOSIS — Z8041 Family history of malignant neoplasm of ovary: Secondary | ICD-10-CM | POA: Insufficient documentation

## 2020-06-20 DIAGNOSIS — Z79899 Other long term (current) drug therapy: Secondary | ICD-10-CM | POA: Insufficient documentation

## 2020-06-20 DIAGNOSIS — E538 Deficiency of other specified B group vitamins: Secondary | ICD-10-CM | POA: Diagnosis not present

## 2020-06-20 DIAGNOSIS — D509 Iron deficiency anemia, unspecified: Secondary | ICD-10-CM | POA: Insufficient documentation

## 2020-06-20 DIAGNOSIS — Z8042 Family history of malignant neoplasm of prostate: Secondary | ICD-10-CM | POA: Diagnosis not present

## 2020-06-20 DIAGNOSIS — Z823 Family history of stroke: Secondary | ICD-10-CM | POA: Insufficient documentation

## 2020-06-20 DIAGNOSIS — I10 Essential (primary) hypertension: Secondary | ICD-10-CM | POA: Diagnosis not present

## 2020-06-23 ENCOUNTER — Inpatient Hospital Stay: Payer: BC Managed Care – PPO

## 2020-06-23 ENCOUNTER — Other Ambulatory Visit: Payer: Self-pay

## 2020-06-23 VITALS — BP 138/84 | HR 82 | Temp 98.5°F | Resp 20

## 2020-06-23 DIAGNOSIS — D509 Iron deficiency anemia, unspecified: Secondary | ICD-10-CM | POA: Diagnosis not present

## 2020-06-23 MED ORDER — SODIUM CHLORIDE 0.9 % IV SOLN
510.0000 mg | Freq: Once | INTRAVENOUS | Status: AC
  Administered 2020-06-23: 510 mg via INTRAVENOUS
  Filled 2020-06-23: qty 510

## 2020-06-23 MED ORDER — DIPHENHYDRAMINE HCL 50 MG/ML IJ SOLN: 25.0000 mg | Freq: Once | INTRAMUSCULAR | Status: DC

## 2020-06-23 MED ORDER — SODIUM CHLORIDE 0.9 % IV SOLN
Freq: Once | INTRAVENOUS | Status: AC
  Filled 2020-06-23: qty 250

## 2020-06-30 ENCOUNTER — Inpatient Hospital Stay: Payer: BC Managed Care – PPO

## 2020-06-30 ENCOUNTER — Other Ambulatory Visit: Payer: Self-pay

## 2020-06-30 VITALS — BP 136/96 | HR 92 | Temp 97.2°F | Resp 18

## 2020-06-30 DIAGNOSIS — D509 Iron deficiency anemia, unspecified: Secondary | ICD-10-CM | POA: Diagnosis not present

## 2020-06-30 MED ORDER — SODIUM CHLORIDE 0.9 % IV SOLN
510.0000 mg | Freq: Once | INTRAVENOUS | Status: AC
  Administered 2020-06-30: 510 mg via INTRAVENOUS
  Filled 2020-06-30: qty 510

## 2020-06-30 MED ORDER — SODIUM CHLORIDE 0.9 % IV SOLN
Freq: Once | INTRAVENOUS | Status: AC
  Filled 2020-06-30: qty 250

## 2020-09-17 NOTE — Progress Notes (Signed)
[facility]  Telephone:([phone]) [phone] [phone]  ID: Cathie Beams OB: 1996-03-13  MR#: [id number]  GUR#:[id number]  Patient Care Team: Patrice Paradise, MD as PCP - General (Physician Assistant) Patient, No Pcp Per (General Practice)  I connected with Cathie Beams on 09/20/20 at  1:45 PM EDT by video enabled telemedicine visit and verified that I am speaking with the correct person using two identifiers.   I discussed the limitations, risks, security and privacy concerns of performing an evaluation and management service by telemedicine and the availability of in-person appointments. I also discussed with the patient that there may be a patient responsible charge related to this service. The patient expressed understanding and agreed to proceed.   Other persons participating in the visit and their role in the encounter: Patient, MD.  Patient's location: Home. Provider's location: Clinic.  CHIEF COMPLAINT: Iron deficiency anemia.  INTERVAL HISTORY: Patient agreed to video assisted telemedicine visit for further evaluation and discussion of her laboratory results.  She continues to have mild fatigue, but otherwise feels well.  She has no neurologic complaints.  She denies any recent fevers or illnesses.  She has a good appetite and denies weight loss.  She has no chest pain, shortness of breath, cough, or hemoptysis.  She denies any nausea, vomiting, constipation, or diarrhea.  She has no melena or hematochezia.  She has no urinary complaints.  Patient offers no further specific complaints today.  REVIEW OF SYSTEMS:   Review of Systems  Constitutional: Positive for malaise/fatigue. Negative for fever and weight loss.  Respiratory: Negative.  Negative for cough, hemoptysis and shortness of breath.   Cardiovascular: Negative.  Negative for chest pain and leg swelling.  Gastrointestinal: Negative.  Negative for abdominal pain, blood in stool and melena.    Genitourinary: Negative.  Negative for hematuria.  Musculoskeletal: Negative.  Negative for back pain.  Skin: Negative.  Negative for rash.  Neurological: Negative.  Negative for dizziness, focal weakness, weakness and headaches.  Psychiatric/Behavioral: Negative.  The patient is not nervous/anxious.     As per HPI. Otherwise, a complete review of systems is negative.  PAST MEDICAL HISTORY: Past Medical History:  Diagnosis Date  . Bacteriuria   . Chlamydia   . Hypertension   . Pregnancy induced hypertension     PAST SURGICAL HISTORY: Past Surgical History:  Procedure Laterality Date  . CESAREAN SECTION N/A 11/08/2013   Procedure: CESAREAN SECTION;  Surgeon: Adam Phenix, MD;  Location: [facility];  Service: Obstetrics;  Laterality: N/A;  . CESAREAN SECTION      FAMILY HISTORY: Family History  Problem Relation Age of Onset  . Stroke Father   . Cancer Maternal Grandmother        Ovarian  . Ovarian cancer Maternal Grandmother   . Cancer Maternal Grandfather        Prostate  . Dementia Paternal Grandmother   . Hypertension Paternal Grandfather   . Breast cancer Neg Hx   . Colon cancer Neg Hx   . Diabetes Neg Hx   . Heart disease Neg Hx     ADVANCED DIRECTIVES (Y/N):  N  HEALTH MAINTENANCE: Social History   Tobacco Use  . Smoking status: Never Smoker  . Smokeless tobacco: Never Used  Vaping Use  . Vaping Use: Never used  Substance Use Topics  . Alcohol use: Not Currently  . Drug use: Never     Colonoscopy:  PAP:  Bone density:  Lipid panel:  Allergies  Allergen Reactions  .  Sulfa Antibiotics Hives    Current Outpatient Medications  Medication Sig Dispense Refill  . cyanocobalamin (,VITAMIN B-12,) 1000 MCG/ML injection Inject 1 mL into muscle every 14 days for 3 months, then once a month for 3 months.    . Prenatal Vit-Fe Fumarate-FA (PRENATAL MULTIVITAMIN) TABS tablet Take 1 tablet by mouth daily at 12 noon.     No current facility-administered  medications for this visit.    OBJECTIVE: There were no vitals filed for this visit.   There is no height or weight on file to calculate BMI.    ECOG FS:0 - Asymptomatic  General: Well-developed, well-nourished, no acute distress. HEENT: Normocephalic. Neuro: Alert, answering all questions appropriately. Cranial nerves grossly intact. Psych: Normal affect.   LAB RESULTS:  Lab Results  Component Value Date   CREATININE 0.51 11/02/2013   AST 21 11/02/2013   ALT 13 11/02/2013    Lab Results  Component Value Date   WBC 11.3 (H) 09/19/2020   NEUTROABS 8.2 (H) 09/19/2020   HGB 13.3 09/19/2020   HCT 40.4 09/19/2020   MCV 90.2 09/19/2020   PLT 473 (H) 09/19/2020   Lab Results  Component Value Date   IRON 62 09/19/2020   TIBC 372 09/19/2020   IRONPCTSAT 17 09/19/2020   Lab Results  Component Value Date   FERRITIN 51 09/19/2020     STUDIES: No results found.  ASSESSMENT: Iron deficiency anemia.  PLAN:    1. Iron deficiency anemia: Patient reports she cannot tolerate oral iron supplementation.  Her hemoglobin and iron stores are now within normal limits.  She does not require additional IV Feraheme today.  Patient last received clinic on June 30, 2020.  Return to clinic in 3 months with repeat laboratory work, further evaluation, and continuation of treatment if needed.   2.  B12 deficiency: Resolved.  Repeat B12 levels in 3 months as above.  I provided 20 minutes of face-to-face video visit time during this encounter which included chart review, counseling, and coordination of care as documented above.   Patient expressed understanding and was in agreement with this plan. She also understands that She can call clinic at any time with any questions, concerns, or complaints.    Jeralyn Ruths, MD   09/20/2020 4:50 PM

## 2020-09-19 ENCOUNTER — Other Ambulatory Visit: Payer: Self-pay

## 2020-09-19 ENCOUNTER — Inpatient Hospital Stay: Payer: BC Managed Care – PPO | Attending: Oncology

## 2020-09-19 DIAGNOSIS — Z8041 Family history of malignant neoplasm of ovary: Secondary | ICD-10-CM | POA: Diagnosis not present

## 2020-09-19 DIAGNOSIS — D509 Iron deficiency anemia, unspecified: Secondary | ICD-10-CM | POA: Diagnosis present

## 2020-09-19 DIAGNOSIS — I10 Essential (primary) hypertension: Secondary | ICD-10-CM | POA: Diagnosis not present

## 2020-09-19 DIAGNOSIS — Z8249 Family history of ischemic heart disease and other diseases of the circulatory system: Secondary | ICD-10-CM | POA: Insufficient documentation

## 2020-09-19 DIAGNOSIS — Z79899 Other long term (current) drug therapy: Secondary | ICD-10-CM | POA: Insufficient documentation

## 2020-09-19 DIAGNOSIS — Z8042 Family history of malignant neoplasm of prostate: Secondary | ICD-10-CM | POA: Insufficient documentation

## 2020-09-19 LAB — IRON AND TIBC
Iron: 62 ug/dL (ref 28–170)
Saturation Ratios: 17 % (ref 10.4–31.8)
TIBC: 372 ug/dL (ref 250–450)
UIBC: 310 ug/dL

## 2020-09-19 LAB — CBC WITH DIFFERENTIAL/PLATELET
Abs Immature Granulocytes: 0.03 10*3/uL (ref 0.00–0.07)
Basophils Absolute: 0.1 10*3/uL (ref 0.0–0.1)
Basophils Relative: 0 %
Eosinophils Absolute: 0.1 10*3/uL (ref 0.0–0.5)
Eosinophils Relative: 1 %
HCT: 40.4 % (ref 36.0–46.0)
Hemoglobin: 13.3 g/dL (ref 12.0–15.0)
Immature Granulocytes: 0 %
Lymphocytes Relative: 19 %
Lymphs Abs: 2.2 10*3/uL (ref 0.7–4.0)
MCH: 29.7 pg (ref 26.0–34.0)
MCHC: 32.9 g/dL (ref 30.0–36.0)
MCV: 90.2 fL (ref 80.0–100.0)
Monocytes Absolute: 0.8 10*3/uL (ref 0.1–1.0)
Monocytes Relative: 7 %
Neutro Abs: 8.2 10*3/uL — ABNORMAL HIGH (ref 1.7–7.7)
Neutrophils Relative %: 73 %
Platelets: 473 10*3/uL — ABNORMAL HIGH (ref 150–400)
RBC: 4.48 MIL/uL (ref 3.87–5.11)
RDW: 16.6 % — ABNORMAL HIGH (ref 11.5–15.5)
WBC: 11.3 10*3/uL — ABNORMAL HIGH (ref 4.0–10.5)
nRBC: 0 % (ref 0.0–0.2)

## 2020-09-19 LAB — VITAMIN B12: Vitamin B-12: 374 pg/mL (ref 180–914)

## 2020-09-19 LAB — FERRITIN: Ferritin: 51 ng/mL (ref 11–307)

## 2020-09-20 ENCOUNTER — Inpatient Hospital Stay: Payer: BC Managed Care – PPO

## 2020-09-20 ENCOUNTER — Inpatient Hospital Stay (HOSPITAL_BASED_OUTPATIENT_CLINIC_OR_DEPARTMENT_OTHER): Payer: BC Managed Care – PPO | Admitting: Oncology

## 2020-09-20 ENCOUNTER — Encounter: Payer: Self-pay | Admitting: Oncology

## 2020-09-20 DIAGNOSIS — D509 Iron deficiency anemia, unspecified: Secondary | ICD-10-CM

## 2020-11-19 NOTE — L&D Delivery Note (Signed)
Delivery Note  Date of delivery: 08/28/2021 Estimated Date of Delivery: 09/22/21 Patient's last menstrual period was 12/16/2020. EGA: [redacted]w[redacted]d  Delivery Note At 8:16 PM a viable female was delivered via VBAC, Spontaneous (Presentation: Right Occiput Anterior).  APGAR: , ; weight pending.  Placenta status: Spontaneous, Intact.  Cord: 3 vessels with the following complications: None.  Cord pH: none  First Stage: Labor onset: 2300 Augmentation : none Analgesia /Anesthesia intrapartum: epidural SROM at 0754  [name] presented to L&D with uterine contractions/PTL. She was expectantly managed. Epidural placed for pain relief.   Second Stage: Complete dilation at 1825 Onset of pushing at 1825 FHR second stage Cat I / Cat II Delivery at 2016 on 08/28/2021  She progressed to complete and had a spontaneous VBAC birth of a live female over an intact perineum. The fetal head was delivered in OA position with restitution to ROA. No nuchal cord. Anterior then posterior shoulders delivered spontaneously. Baby placed on mom's abdomen and attended to by Neonatologist and transition RN. Cord clamped and cut when pulseless by FOB.   Third Stage: Placenta delivered intact with 3VC at 2019 Placenta disposition: Pathology Uterine tone firm / bleeding min IV pitocin given for hemorrhage prophylaxis  Anesthesia: Epidural Episiotomy: None Lacerations: None Suture Repair: n/a Est. Blood Loss (mL): 350  Complications: none  Mom to postpartum.  Baby to Couplet care / Skin to Skin.  Newborn: Birth Weight: pending  Apgar Scores: pending Feeding planned: breast   [name], CNM 08/28/2021 8:44 PM

## 2020-11-30 ENCOUNTER — Telehealth: Payer: Self-pay | Admitting: *Deleted

## 2020-11-30 NOTE — Telephone Encounter (Signed)
Recommend pcp cor urgent care.  Likely not her Fe+ def.  Consider COVID testing.

## 2020-11-30 NOTE — Telephone Encounter (Signed)
Call returned to patient and informed of doctor response, she stated OK, thank you

## 2020-11-30 NOTE — Telephone Encounter (Signed)
Patient called reporting that she is having dizziness and nausea and would like to know what to do. Please advise

## 2020-12-19 ENCOUNTER — Other Ambulatory Visit: Payer: Self-pay | Admitting: Oncology

## 2020-12-23 ENCOUNTER — Inpatient Hospital Stay: Payer: BC Managed Care – PPO

## 2020-12-26 ENCOUNTER — Inpatient Hospital Stay: Payer: BC Managed Care – PPO | Admitting: Oncology

## 2020-12-26 ENCOUNTER — Inpatient Hospital Stay: Payer: BC Managed Care – PPO

## 2021-02-03 LAB — OB RESULTS CONSOLE GC/CHLAMYDIA
Chlamydia: NEGATIVE
Gonorrhea: NEGATIVE

## 2021-02-06 DIAGNOSIS — O09892 Supervision of other high risk pregnancies, second trimester: Secondary | ICD-10-CM | POA: Insufficient documentation

## 2021-03-10 LAB — OB RESULTS CONSOLE HIV ANTIBODY (ROUTINE TESTING)
HIV: NONREACTIVE
HIV: NONREACTIVE
HIV: NONREACTIVE

## 2021-03-10 LAB — OB RESULTS CONSOLE VARICELLA ZOSTER ANTIBODY, IGG: Varicella: IMMUNE

## 2021-03-10 LAB — OB RESULTS CONSOLE RPR
RPR: NONREACTIVE
RPR: NONREACTIVE

## 2021-03-10 LAB — OB RESULTS CONSOLE RUBELLA ANTIBODY, IGM: Rubella: IMMUNE

## 2021-03-10 LAB — OB RESULTS CONSOLE HEPATITIS B SURFACE ANTIGEN: Hepatitis B Surface Ag: NEGATIVE

## 2021-03-11 LAB — OB RESULTS CONSOLE RPR: RPR: NONREACTIVE

## 2021-03-11 LAB — OB RESULTS CONSOLE HEPATITIS B SURFACE ANTIGEN: Hepatitis B Surface Ag: NEGATIVE

## 2021-03-11 LAB — OB RESULTS CONSOLE VARICELLA ZOSTER ANTIBODY, IGG: Varicella: IMMUNE

## 2021-03-11 LAB — OB RESULTS CONSOLE RUBELLA ANTIBODY, IGM: Rubella: IMMUNE

## 2021-08-26 ENCOUNTER — Observation Stay
Admission: EM | Admit: 2021-08-26 | Discharge: 2021-08-26 | Disposition: A | Discharge status: Home / Self Care | Payer: BC Managed Care – PPO | Source: Home / Self Care | Admitting: Obstetrics and Gynecology

## 2021-08-26 ENCOUNTER — Other Ambulatory Visit: Payer: Self-pay

## 2021-08-26 ENCOUNTER — Observation Stay: Payer: BC Managed Care – PPO

## 2021-08-26 ENCOUNTER — Encounter: Payer: Self-pay | Admitting: Obstetrics and Gynecology

## 2021-08-26 DIAGNOSIS — N939 Abnormal uterine and vaginal bleeding, unspecified: Secondary | ICD-10-CM | POA: Diagnosis present

## 2021-08-26 DIAGNOSIS — Z3A36 36 weeks gestation of pregnancy: Secondary | ICD-10-CM | POA: Insufficient documentation

## 2021-08-26 DIAGNOSIS — O10013 Pre-existing essential hypertension complicating pregnancy, third trimester: Secondary | ICD-10-CM | POA: Insufficient documentation

## 2021-08-26 DIAGNOSIS — O134 Gestational [pregnancy-induced] hypertension without significant proteinuria, complicating childbirth: Secondary | ICD-10-CM | POA: Diagnosis not present

## 2021-08-26 DIAGNOSIS — O26853 Spotting complicating pregnancy, third trimester: Secondary | ICD-10-CM | POA: Insufficient documentation

## 2021-08-26 LAB — URINALYSIS, ROUTINE W REFLEX MICROSCOPIC
Bacteria, UA: NONE SEEN
Bilirubin Urine: NEGATIVE
Glucose, UA: NEGATIVE mg/dL
Ketones, ur: 5 mg/dL — AB
Nitrite: NEGATIVE
Protein, ur: NEGATIVE mg/dL
RBC / HPF: >50 RBC/hpf — ABNORMAL HIGH (ref 0–5)
Specific Gravity, Urine: 1.008 (ref 1.005–1.030)
WBC, UA: >50 WBC/hpf — ABNORMAL HIGH (ref 0–5)
pH: 7 (ref 5.0–8.0)

## 2021-08-26 LAB — COMPREHENSIVE METABOLIC PANEL
ALT: 13 U/L (ref 0–44)
AST: 17 U/L (ref 15–41)
Albumin: 2.6 g/dL — ABNORMAL LOW (ref 3.5–5.0)
Alkaline Phosphatase: 168 U/L — ABNORMAL HIGH (ref 38–126)
Anion gap: 9 (ref 5–15)
BUN: 5 mg/dL — ABNORMAL LOW (ref 6–20)
CO2: 21 mmol/L — ABNORMAL LOW (ref 22–32)
Calcium: 8.6 mg/dL — ABNORMAL LOW (ref 8.9–10.3)
Chloride: 103 mmol/L (ref 98–111)
Creatinine, Ser: 0.48 mg/dL (ref 0.44–1.00)
GFR, Estimated: >60 mL/min (ref >60)
Glucose, Bld: 93 mg/dL (ref 70–99)
Potassium: 3.5 mmol/L (ref 3.5–5.1)
Sodium: 133 mmol/L — ABNORMAL LOW (ref 135–145)
Total Bilirubin: 0.6 mg/dL (ref 0.3–1.2)
Total Protein: 6.3 g/dL — ABNORMAL LOW (ref 6.5–8.1)

## 2021-08-26 LAB — CBC
HCT: 30.4 % — ABNORMAL LOW (ref 36.0–46.0)
Hemoglobin: 10.2 g/dL — ABNORMAL LOW (ref 12.0–15.0)
MCH: 29.4 pg (ref 26.0–34.0)
MCHC: 33.6 g/dL (ref 30.0–36.0)
MCV: 87.6 fL (ref 80.0–100.0)
Platelets: 281 10*3/uL (ref 150–400)
RBC: 3.47 MIL/uL — ABNORMAL LOW (ref 3.87–5.11)
RDW: 14 % (ref 11.5–15.5)
WBC: 19 10*3/uL — ABNORMAL HIGH (ref 4.0–10.5)
nRBC: 0 % (ref 0.0–0.2)

## 2021-08-26 LAB — WET PREP, GENITAL
Clue Cells Wet Prep HPF POC: NONE SEEN
Sperm: NONE SEEN
Trich, Wet Prep: NONE SEEN
Yeast Wet Prep HPF POC: NONE SEEN

## 2021-08-26 LAB — PROTEIN / CREATININE RATIO, URINE
Creatinine, Urine: 48 mg/dL
Protein Creatinine Ratio: 0.15 mg/mg{Cre} (ref 0.00–0.15)
Total Protein, Urine: 7 mg/dL

## 2021-08-26 LAB — GROUP B STREP BY PCR: Group B strep by PCR: NEGATIVE

## 2021-08-26 MED ORDER — LACTATED RINGERS IV SOLN
INTRAVENOUS | Status: AC
Start: 2021-08-26 — End: 2021-08-26

## 2021-08-26 MED ORDER — ACETAMINOPHEN 325 MG PO TABS: 650.0000 mg | ORAL_TABLET | ORAL | Status: DC | PRN

## 2021-08-26 MED ORDER — CALCIUM CARBONATE ANTACID 500 MG PO CHEW: 2.0000 | CHEWABLE_TABLET | ORAL | Status: DC | PRN

## 2021-08-26 MED ORDER — DOCUSATE SODIUM 100 MG PO CAPS: 100.0000 mg | ORAL_CAPSULE | Freq: Every day | ORAL | Status: DC

## 2021-08-26 NOTE — Progress Notes (Signed)
Doris Lowe is a 25 y.o. female. She is at [redacted]w[redacted]d gestation. No LMP recorded. Patient is pregnant. Estimated Date of Delivery: 09/21/21  Prenatal care site: [facility]   Current pregnancy complicated by:  1. Prior CS 2014  (H/o prior Cesarean Section for cord prolapse during 37-38wk IOL for pre-e. Confirmed LTCS per notes found in care everywhere. Pt desires TOLAC.) 2. Prior Pre-eclampsia  3. BMI 37 4. Abnormal Pap: LSIL, HPV POS 5. Severe Vit D deficiency  6. Cholelithiasis  Chief complaint: vaginal bleeding since 0300 and contractions q24min  Location: bleeding dark red blood from the vagina Onset/timing: bleeding small amounts since 0300,  Duration: Irregular contractions that started yesterday that are now q5-22min Quality: She reports she first noticed spotting when she wiped and had an "Oreo"-sized spot of dark blood on her pad when she arrived to L&D. Has had a new pad on for 1hr and spotting present. She is able to talk through the contractions, not breathing through them or wincing with them. Describes the contractions as "sharp" and she feels them anteriorly. Severity: Rates the contractions as 5/10 Aggravating or alleviating conditions: none Associated signs/symptoms: good fetal movement. Denies any recent intercourse, vaginal discharge, itching/burning, odors.   S: Resting comfortably. CTX present, VB present, no LOF,  Active fetal movement.  Denies: HA, visual changes, SOB, or RUQ/epigastric pain  Maternal Medical History:   Past Medical History:  Diagnosis Date   Bacteriuria    Chlamydia    Hypertension    Pregnancy induced hypertension     Past Surgical History:  Procedure Laterality Date   CESAREAN SECTION N/A 11/08/2013   Procedure: CESAREAN SECTION;  Surgeon: Adam Phenix, MD;  Location: [facility];  Service: Obstetrics;  Laterality: N/A;   CESAREAN SECTION      Allergies  Allergen Reactions   Sulfa Antibiotics Hives    Prior to Admission  medications   Medication Sig Start Date End Date Taking? Authorizing Provider  cyanocobalamin (,VITAMIN B-12,) 1000 MCG/ML injection Inject 1 mL into muscle every 14 days for 3 months, then once a month for 3 months. 06/06/20   [provider]  Prenatal Vit-Fe Fumarate-FA (PRENATAL MULTIVITAMIN) TABS tablet Take 1 tablet by mouth daily at 12 noon.    [provider]      Social History: She  reports that she has never smoked. She has never used smokeless tobacco. She reports that she does not currently use alcohol. She reports that she does not use drugs.  Family History: family history includes Cancer in her maternal grandfather and maternal grandmother; Dementia in her paternal grandmother; Hypertension in her paternal grandfather; Ovarian cancer in her maternal grandmother; Stroke in her father.  no history of gyn cancers  Review of Systems: A full review of systems was performed and negative except as noted in the HPI.     O:  BP 140/78 (BP Location: Right Arm)   Pulse (!) 113   Temp 98.4 F (36.9 C) (Oral)   Resp 18   Ht 5' (1.524 m)   BMI 34.51 kg/m  Results for orders placed or performed during the hospital encounter of 08/26/21 (from the past 48 hour(s))  Urinalysis, Routine w reflex microscopic Urine, Clean Catch   Collection Time: 08/26/21  5:32 AM  Result Value Ref Range   Color, Urine YELLOW (A) YELLOW   APPearance HAZY (A) CLEAR   Specific Gravity, Urine 1.008 1.005 - 1.030   pH 7.0 5.0 - 8.0   Glucose,  UA NEGATIVE NEGATIVE mg/dL   Hgb urine dipstick LARGE (A) NEGATIVE   Bilirubin Urine NEGATIVE NEGATIVE   Ketones, ur 5 (A) NEGATIVE mg/dL   Protein, ur NEGATIVE NEGATIVE mg/dL   Nitrite NEGATIVE NEGATIVE   Leukocytes,Ua LARGE (A) NEGATIVE   RBC / HPF >50 (H) 0 - 5 RBC/hpf   WBC, UA >50 (H) 0 - 5 WBC/hpf   Bacteria, UA NONE SEEN NONE SEEN   Squamous Epithelial / LPF 0-5 0 - 5  Wet prep, genital   Collection Time: 08/26/21  5:33 AM  Result Value  Ref Range   Yeast Wet Prep HPF POC NONE SEEN NONE SEEN   Trich, Wet Prep NONE SEEN NONE SEEN   Clue Cells Wet Prep HPF POC NONE SEEN NONE SEEN   WBC, Wet Prep HPF POC MODERATE (A) NONE SEEN   Sperm NONE SEEN       Constitutional: NAD, AAOx3  HE/ENT: extraocular movements grossly intact, moist mucous membranes CV: RRR PULM: nl respiratory effort, CTABL     Abd: gravid, non-tender, non-distended, soft , contractions palpate mild, relaxed tone in between.      Ext: Non-tender, Nonedematous   Psych: mood appropriate, speech normal Pelvic: SSE done  Pelvic exam:  VULVA: normal appearing vulva with no masses, tenderness or lesions, VAGINA: normal appearing vagina with normal color and discharge, no lesions, CERVIX: normal appearing cervix without discharge or lesions, thick bloody mucus present, WET MOUNT done - results: negative for pathogens, normal epithelial cells, exam chaperoned by Colbert Ewing, RN.  Fetal  monitoring: Cat 1 Appropriate for GA Baseline: 135bpm Variability: moderate Accelerations: present x >2 Decelerations absent Time continuous   A/P: 25 y.o. [redacted]w[redacted]d here for antenatal surveillance for vaginal bleeding and contractions  Principle Diagnosis:  Normal pregnancy in third trimester  Pre-term contractions present. She made cervical change from fingertip/thick/posterior/ballottable to 1/50/-2/anterior/soft, she is wanting to [facility]. Contractions palpate mild and uterus soft in between contractions, she is talking through them, not wincing or breathing through contractions. Received 1L LR bolus with no change in contraction pattern. GBS PCR collected. Placenta is fundal, per anatomy US. Bedside OB US ordered. Wet prep and UA negative.  Fetal Wellbeing: Reassuring Cat 1 tracing. Reviewed POC with Dr. Feliberto Gottron. Waiting for ultrasound results. Will continue monitoring.    Janyce Llanos, CNM 08/26/2021 7:11 AM

## 2021-08-26 NOTE — Progress Notes (Signed)
Discharge home. Discharge information given. To follow up with [facility] on Monday or Tuesday. Left floor ambulatory. Doris Lowe

## 2021-08-26 NOTE — Discharge Summary (Signed)
Doris Lowe is a 25 y.o. female. She is at [redacted]w[redacted]d gestation. Patient's last menstrual period was 12/16/2020. Estimated Date of Delivery: 09/22/21  Prenatal care site: [facility]  Chief complaint: vaginal bleeding   HPI: [name] presents to L&D with complaints of vaginal bleeding.  She reported small amount of vaginal bleeding since 0300 this morning.  Reports irregular contractions that started yesterday and then progressed to every 5-6 minutes.  Felt like contractions were getting stronger and closer together.  Denies LOF or recent intercourse.  Endorses good fetal movement.    Factors complicating pregnancy: 1. Prior CS 2014  (H/o prior Cesarean Section for cord prolapse during 37-38wk IOL for pre-e. Confirmed LTCS per notes found in care everywhere. Pt desires TOLAC.) 2. Prior Pre-eclampsia  3. BMI 37 4. Abnormal Pap: LSIL, HPV POS 5. Severe Vit D deficiency  6. Cholelithiasis  S: Resting comfortably. no LOF,  Active fetal movement.   Maternal Medical History:  Past Medical Hx:  has a past medical history of Bacteriuria, Chlamydia, Hypertension, and Pregnancy induced hypertension.    Past Surgical Hx:  has a past surgical history that includes Cesarean section (N/A, 11/08/2013) and Cesarean section.   Allergies  Allergen Reactions   Sulfa Antibiotics Hives     Prior to Admission medications   Medication Sig Start Date End Date Taking? Authorizing Provider  cyanocobalamin (,VITAMIN B-12,) 1000 MCG/ML injection Inject 1 mL into muscle every 14 days for 3 months, then once a month for 3 months. 06/06/20   [provider]  Prenatal Vit-Fe Fumarate-FA (PRENATAL MULTIVITAMIN) TABS tablet Take 1 tablet by mouth daily at 12 noon.    [provider]    Social History: She  reports that she has never smoked. She has never used smokeless tobacco. She reports that she does not currently use alcohol. She reports that she does not use drugs.  Family History:  family history includes Cancer in her maternal grandfather and maternal grandmother; Dementia in her paternal grandmother; Hypertension in her paternal grandfather; Ovarian cancer in her maternal grandmother; Stroke in her father.   Review of Systems: A full review of systems was performed and negative except as noted in the HPI.    O:  BP 116/73   Pulse (!) 109   Temp 98.4 F (36.9 C) (Oral)   Resp 18   Ht 5' (1.524 m)   LMP 12/16/2020   BMI 34.51 kg/m  Results for orders placed or performed during the hospital encounter of 08/26/21 (from the past 48 hour(s))  Urinalysis, Routine w reflex microscopic Urine, Clean Catch   Collection Time: 08/26/21  5:32 AM  Result Value Ref Range   Color, Urine YELLOW (A) YELLOW   APPearance HAZY (A) CLEAR   Specific Gravity, Urine 1.008 1.005 - 1.030   pH 7.0 5.0 - 8.0   Glucose, UA NEGATIVE NEGATIVE mg/dL   Hgb urine dipstick LARGE (A) NEGATIVE   Bilirubin Urine NEGATIVE NEGATIVE   Ketones, ur 5 (A) NEGATIVE mg/dL   Protein, ur NEGATIVE NEGATIVE mg/dL   Nitrite NEGATIVE NEGATIVE   Leukocytes,Ua LARGE (A) NEGATIVE   RBC / HPF >50 (H) 0 - 5 RBC/hpf   WBC, UA >50 (H) 0 - 5 WBC/hpf   Bacteria, UA NONE SEEN NONE SEEN   Squamous Epithelial / LPF 0-5 0 - 5  Wet prep, genital   Collection Time: 08/26/21  5:33 AM  Result Value Ref Range   Yeast Wet Prep HPF POC NONE SEEN NONE SEEN  Trich, Wet Prep NONE SEEN NONE SEEN   Clue Cells Wet Prep HPF POC NONE SEEN NONE SEEN   WBC, Wet Prep HPF POC MODERATE (A) NONE SEEN   Sperm NONE SEEN   Group B strep by PCR   Collection Time: 08/26/21  7:24 AM   Specimen: Vaginal/Rectal; Genital  Result Value Ref Range   Group B strep by PCR NEGATIVE NEGATIVE  Protein / creatinine ratio, urine   Collection Time: 08/26/21  7:47 AM  Result Value Ref Range   Creatinine, Urine 48 mg/dL   Total Protein, Urine 7 mg/dL   Protein Creatinine Ratio 0.15 0.00 - 0.15 mg/mg[Cre]  CBC   Collection Time: 08/26/21  7:54 AM   Result Value Ref Range   WBC 19.0 (H) 4.0 - 10.5 K/uL   RBC 3.47 (L) 3.87 - 5.11 MIL/uL   Hemoglobin 10.2 (L) 12.0 - 15.0 g/dL   HCT 85.6 (L) 31.4 - 97.0 %   MCV 87.6 80.0 - 100.0 fL   MCH 29.4 26.0 - 34.0 pg   MCHC 33.6 30.0 - 36.0 g/dL   RDW 26.3 78.5 - 88.5 %   Platelets 281 150 - 400 K/uL   nRBC 0.0 0.0 - 0.2 %  Comprehensive metabolic panel   Collection Time: 08/26/21  7:54 AM  Result Value Ref Range   Sodium 133 (L) 135 - 145 mmol/L   Potassium 3.5 3.5 - 5.1 mmol/L   Chloride 103 98 - 111 mmol/L   CO2 21 (L) 22 - 32 mmol/L   Glucose, Bld 93 70 - 99 mg/dL   BUN 5 (L) 6 - 20 mg/dL   Creatinine, Ser 0.27 0.44 - 1.00 mg/dL   Calcium 8.6 (L) 8.9 - 10.3 mg/dL   Total Protein 6.3 (L) 6.5 - 8.1 g/dL   Albumin 2.6 (L) 3.5 - 5.0 g/dL   AST 17 15 - 41 U/L   ALT 13 0 - 44 U/L   Alkaline Phosphatase 168 (H) 38 - 126 U/L   Total Bilirubin 0.6 0.3 - 1.2 mg/dL   GFR, Estimated >74 >12 mL/min   Anion gap 9 5 - 15      Constitutional: NAD, AAOx3  HE/ENT: extraocular movements grossly intact, moist mucous membranes CV: RRR PULM: nl respiratory effort, CTABL Abd: gravid, non-tender, non-distended, soft  Ext: Non-tender, Nonedmeatous Psych: mood appropriate, speech normal Pelvic :  small amount of blood-tinged mucus SVE: Dilation: 1 Effacement (%): Thick Cervical Position: Anterior Station: Ballotable Presentation: Vertex Exam by:: Tami Lin, CNM   No cervical change for > 4 hours   Fetal Monitor: Baseline: 145 bpm Variability: moderate Accels: Present Decels: none Toco: Irregular, every 5-10 minutes, mild to palpation   Category: I   Assessment: 25 y.o. [redacted]w[redacted]d here for antenatal surveillance during pregnancy.  Principle diagnosis: Vaginal bleeding in 3rd trimester, threatened preterm labor  The encounter diagnosis was Vaginal bleeding.   Plan: Labor: not present.  Fetal Wellbeing: Reassuring Cat 1 tracing. Reactive NST  Contractions less frequent after IVF bolus  with no cervical change over > 4 hours  Vaginal bleeding scant to small - blood tinged mucus Mild range b/p's noted initially, subsequent blood pressures WNL, PreE labs normal We reviewed preterm labor precautions and when to return to the hospital  Pelvic rest  D/c home stable, precautions reviewed, follow-up in office on Monday or Tuesday for b/p check   ----- Margaretmary Eddy, CNM Certified Nurse Midwife [facility]  [facility]

## 2021-08-26 NOTE — OB Triage Note (Signed)
Pt [name] 25 y.o. presents to labor and delivery triage reporting vaginal bleeding and contractions every 5 minutes. Pt is a G3P1011 at [redacted]w[redacted]d. Pt denies signs and symptoms consistent with rupture of membranes. Pt states positive fetal movement. External FM and TOCO applied to non-tender abdomen and assessing. Initial FHR 150. Vital signs obtained and within normal limits. Provider notified of pt.

## 2021-08-27 ENCOUNTER — Other Ambulatory Visit: Payer: Self-pay

## 2021-08-27 ENCOUNTER — Encounter: Payer: Self-pay | Admitting: Obstetrics and Gynecology

## 2021-08-27 ENCOUNTER — Observation Stay
Admission: EM | Admit: 2021-08-27 | Discharge: 2021-08-27 | Disposition: A | Discharge status: Home / Self Care | Payer: BC Managed Care – PPO | Source: Home / Self Care | Admitting: Obstetrics and Gynecology

## 2021-08-27 DIAGNOSIS — E669 Obesity, unspecified: Secondary | ICD-10-CM | POA: Insufficient documentation

## 2021-08-27 DIAGNOSIS — O99213 Obesity complicating pregnancy, third trimester: Secondary | ICD-10-CM | POA: Insufficient documentation

## 2021-08-27 DIAGNOSIS — Z3A36 36 weeks gestation of pregnancy: Secondary | ICD-10-CM | POA: Insufficient documentation

## 2021-08-27 DIAGNOSIS — Z6837 Body mass index (BMI) 37.0-37.9, adult: Secondary | ICD-10-CM | POA: Insufficient documentation

## 2021-08-27 DIAGNOSIS — O26853 Spotting complicating pregnancy, third trimester: Secondary | ICD-10-CM | POA: Insufficient documentation

## 2021-08-27 DIAGNOSIS — O4703 False labor before 37 completed weeks of gestation, third trimester: Secondary | ICD-10-CM | POA: Insufficient documentation

## 2021-08-27 DIAGNOSIS — O133 Gestational [pregnancy-induced] hypertension without significant proteinuria, third trimester: Secondary | ICD-10-CM | POA: Insufficient documentation

## 2021-08-27 DIAGNOSIS — O26843 Uterine size-date discrepancy, third trimester: Secondary | ICD-10-CM | POA: Insufficient documentation

## 2021-08-27 LAB — TYPE AND SCREEN
ABO/RH(D): B POS
Antibody Screen: NEGATIVE

## 2021-08-27 LAB — CBC WITH DIFFERENTIAL/PLATELET
Abs Immature Granulocytes: 0.22 10*3/uL — ABNORMAL HIGH (ref 0.00–0.07)
Basophils Absolute: 0.1 10*3/uL (ref 0.0–0.1)
Basophils Relative: 0 %
Eosinophils Absolute: 0 10*3/uL (ref 0.0–0.5)
Eosinophils Relative: 0 %
HCT: 33 % — ABNORMAL LOW (ref 36.0–46.0)
Hemoglobin: 10.9 g/dL — ABNORMAL LOW (ref 12.0–15.0)
Immature Granulocytes: 1 %
Lymphocytes Relative: 8 %
Lymphs Abs: 1.6 10*3/uL (ref 0.7–4.0)
MCH: 29.5 pg (ref 26.0–34.0)
MCHC: 33 g/dL (ref 30.0–36.0)
MCV: 89.2 fL (ref 80.0–100.0)
Monocytes Absolute: 1.3 10*3/uL — ABNORMAL HIGH (ref 0.1–1.0)
Monocytes Relative: 6 %
Neutro Abs: 17.5 10*3/uL — ABNORMAL HIGH (ref 1.7–7.7)
Neutrophils Relative %: 85 %
Platelets: 299 10*3/uL (ref 150–400)
RBC: 3.7 MIL/uL — ABNORMAL LOW (ref 3.87–5.11)
RDW: 13.9 % (ref 11.5–15.5)
WBC: 20.7 10*3/uL — ABNORMAL HIGH (ref 4.0–10.5)
nRBC: 0 % (ref 0.0–0.2)

## 2021-08-27 MED ORDER — PROMETHAZINE HCL 25 MG/ML IJ SOLN
25.0000 mg | Freq: Once | INTRAMUSCULAR | Status: AC
  Administered 2021-08-27: 25 mg via INTRAMUSCULAR
  Filled 2021-08-27: qty 1

## 2021-08-27 MED ORDER — LACTATED RINGERS IV SOLN: INTRAVENOUS | Status: DC

## 2021-08-27 MED ORDER — ACETAMINOPHEN 500 MG PO TABS: 1000.0000 mg | ORAL_TABLET | Freq: Four times a day (QID) | ORAL | Status: DC | PRN

## 2021-08-27 MED ORDER — BUTORPHANOL TARTRATE 1 MG/ML IJ SOLN
INTRAMUSCULAR | Status: AC
  Filled 2021-08-27: qty 1

## 2021-08-27 MED ORDER — BUTORPHANOL TARTRATE 1 MG/ML IJ SOLN: 1.0000 mg | Freq: Once | INTRAMUSCULAR | Status: DC

## 2021-08-27 MED ORDER — MORPHINE SULFATE (PF) 10 MG/ML IV SOLN
10.0000 mg | Freq: Once | INTRAVENOUS | Status: AC
  Administered 2021-08-27: 10 mg via INTRAMUSCULAR
  Filled 2021-08-27: qty 1

## 2021-08-27 MED ORDER — CALCIUM CARBONATE ANTACID 500 MG PO CHEW: 2.0000 | CHEWABLE_TABLET | ORAL | Status: DC | PRN

## 2021-08-27 NOTE — OB Triage Note (Signed)
Pt [name] 25 y.o. presents to labor and delivery triage reporting contractions q3-5 minutes, rating contraction pain as 7-8 out of 10. Pt is a G3P1011 at [redacted]w[redacted]d. Pt denies signs and symptoms consistent with rupture of membranes or active vaginal bleeding. Pt states positive fetal movement. External FM and TOCO applied to non-tender abdomen and assessing. Initial FHR 155. Vital signs obtained and within normal limits. Provider notified of pt.

## 2021-08-27 NOTE — H&P (Signed)
HISTORY AND PHYSICAL NOTE  History of Present Illness: Doris Lowe is a 25 y.o. G3P1011 at [redacted]w[redacted]d observed for threatened preterm labor.  She presented to L&D with complaints of worsening contractions.  Doris Lowe was evaluated in L&D triage yesterday for preterm contracitons and vaginal bleeding.  Contractions decreased in frequency and vaginal bleeding improved.  Her cervix was unchanged and she was discharged home with PTL precautions.  Contractions become more frequent and more painful at home and she returned for evaluation.  No cervical change noted over the past 24 hours. Reports bloody show and more painful contractions.      Reports active fetal movement  Contractions: every 3 to 5 minutes LOF/SROM: denies  Vaginal bleeding: small amount of bloody show Fetal presentation is cephalic.  Factors complicating pregnancy:  Prior LTCS for prolapsed cord - desires TOLAC History of preeclampsia  Obesity in pregnancy  Abnormal pap smear  Vitamin D Deficiency  Cholelithiasis   Prenatal care site:  [facility]  Patient Active Problem List   Diagnosis Date Noted   Threatened preterm labor, third trimester 08/27/2021   Vaginal bleeding 08/26/2021   Iron deficiency anemia 06/19/2020    Past Medical History:  Diagnosis Date   Bacteriuria    Chlamydia    Hypertension    Pregnancy induced hypertension     Past Surgical History:  Procedure Laterality Date   CESAREAN SECTION N/A 11/08/2013   Procedure: CESAREAN SECTION;  Surgeon: Adam Phenix, MD;  Location: [facility];  Service: Obstetrics;  Laterality: N/A;   CESAREAN SECTION      OB History  Gravida Para Term Preterm AB Living  3 1 1  0 1 1  SAB IAB Ectopic Multiple Live Births  1 0 0 0 1    # Outcome Date GA Lbr Len/2nd Weight Sex Delivery Anes PTL Lv  3 Current           2 SAB 2018          1 Term 11/08/13 [redacted]w[redacted]d  3325 g F CS-LTranv Gen  LIV    Social History:  reports that she has never smoked. She has never used  smokeless tobacco. She reports that she does not currently use alcohol. She reports that she does not use drugs.  Family History: family history includes Cancer in her maternal grandfather and maternal grandmother; Dementia in her paternal grandmother; Hypertension in her paternal grandfather; Ovarian cancer in her maternal grandmother; Stroke in her father.  Allergies  Allergen Reactions   Sulfa Antibiotics Hives    Medications Prior to Admission  Medication Sig Dispense Refill Last Dose   cyanocobalamin (,VITAMIN B-12,) 1000 MCG/ML injection Inject 1 mL into muscle every 14 days for 3 months, then once a month for 3 months.      Prenatal Vit-Fe Fumarate-FA (PRENATAL MULTIVITAMIN) TABS tablet Take 1 tablet by mouth daily at 12 noon.       ROS  Physical Examination: Vitals:  BP (!) 152/79 (BP Location: Left Arm) Comment: BP during a ctx  Pulse (!) 115   Temp 98.4 F (36.9 C) (Oral)   Resp 16   Ht 5' (1.524 m)   Wt 88.9 kg   LMP 12/16/2020   BMI 38.28 kg/m  General: no acute distress.  HEENT: normocephalic, atraumatic Heart: regular rate & rhythm.  No murmurs/rubs/gallops Lungs: clear to auscultation bilaterally, normal respiratory effort Abdomen: soft, gravid, non-tender;  EFW: 6lbs Pelvic:   External: Normal external female genitalia  Vaginal: small amount of bloody  show  Cervix: Dilation: 1 (external OS 1cm; internal OS 2cm) / Effacement (%): 30 / Station: -3    Extremities: non-tender, symmetric, No edema bilaterally.  DTRs: 2+/2+  Neurologic: Alert & oriented x 3.    Membranes: intact FHT:  FHR: 145 bpm, variability: moderate,  accelerations:  Present,  decelerations:  Absent Category/reactivity:  Category I UC:   regular, every 2-4 minutes  Labs:  Results for orders placed or performed during the hospital encounter of 08/27/21 (from the past 24 hour(s))  CBC with Differential/Platelet   Collection Time: 08/27/21  7:00 AM  Result Value Ref Range   WBC 20.7 (H)  4.0 - 10.5 K/uL   RBC 3.70 (L) 3.87 - 5.11 MIL/uL   Hemoglobin 10.9 (L) 12.0 - 15.0 g/dL   HCT 94.1 (L) 74.0 - 81.4 %   MCV 89.2 80.0 - 100.0 fL   MCH 29.5 26.0 - 34.0 pg   MCHC 33.0 30.0 - 36.0 g/dL   RDW 48.1 85.6 - 31.4 %   Platelets 299 150 - 400 K/uL   nRBC 0.0 0.0 - 0.2 %   Neutrophils Relative % 85 %   Neutro Abs 17.5 (H) 1.7 - 7.7 K/uL   Lymphocytes Relative 8 %   Lymphs Abs 1.6 0.7 - 4.0 K/uL   Monocytes Relative 6 %   Monocytes Absolute 1.3 (H) 0.1 - 1.0 K/uL   Eosinophils Relative 0 %   Eosinophils Absolute 0.0 0.0 - 0.5 K/uL   Basophils Relative 0 %   Basophils Absolute 0.1 0.0 - 0.1 K/uL   Immature Granulocytes 1 %   Abs Immature Granulocytes 0.22 (H) 0.00 - 0.07 K/uL  Type and screen [facility]   Collection Time: 08/27/21  7:00 AM  Result Value Ref Range   ABO/RH(D) PENDING    Antibody Screen PENDING    Sample Expiration      08/30/2021,2359 Performed at [facility], [address] [place], [place] [address]     Prenatal Labs: Blood type/Rh B Pos  Antibody screen neg  Rubella Immune  Varicella Immune  RPR NR  HBsAg Neg  HIV NR  GC neg  Chlamydia neg  Genetic screening cfDNA negative  1 hour GTT 123  3 hour GTT N/A  GBS Neg    Imaging Studies: US OB Limited  Result Date: 08/26/2021 CLINICAL DATA:  25 year old female with history of vaginal bleeding and contractions. EXAM: LIMITED OBSTETRIC ULTRASOUND FINDINGS: Number of Fetuses: 1 Heart Rate:  150 bpm Movement: Present Presentation: Cephalic Previa: None Placental Location: Fundal Amniotic Fluid (Subjective): Normal AFI 9.7 cm (5%ile= 7.7 cm) BPD:  8.8cm 35w 5d Maternal Findings: Cervix:  Closed, approximately 2.9 cm in length Uterus/Adnexae: No abnormality visualized. IMPRESSION: 1. Limited OB ultrasound demonstrating a single viable IUP with normal fetal heart rate of 150 beats per minute, as above. Electronically Signed   By: Trudie Reed M.D.   On: 08/26/2021  08:22    Assessment and Plan: Patient Active Problem List   Diagnosis Date Noted   Threatened preterm labor, third trimester 08/27/2021   Vaginal bleeding 08/26/2021   Iron deficiency anemia 06/19/2020    1. Observation to Antenatal -Clear liquid diet -Cat 1 tracing - reassuring fetal tracing  -Therapeutic rest with IM morphine/phenergan  -Plan to monitor for preterm labor -Desires TOLAC if labor progresses  -Dr. Dalbert Garnet updated on observation -Routine antenatal care  Gustavo Lah, CNM  Certified Nurse Midwife [facility]

## 2021-08-27 NOTE — Progress Notes (Signed)
Pt. Discharged to home. Left floor via wheelchair accompanied by husband and L&D tech. Discharge instructions given. Doris Lowe

## 2021-08-27 NOTE — Discharge Summary (Signed)
Doris Lowe is a 25 y.o. female. She is at [redacted]w[redacted]d gestation. Patient's last menstrual period was 12/16/2020. Estimated Date of Delivery: 09/22/21  Prenatal care site: [facility]  Chief complaint: vaginal bleeding   HPI: Doris Lowe is a 25 y.o. G3P1011 at [redacted]w[redacted]d observed for threatened preterm labor.  She presented to L&D with complaints of worsening contractions.  Doris Lowe was evaluated in L&D triage yesterday for preterm contracitons and vaginal bleeding.  Contractions decreased in frequency and vaginal bleeding improved.  Her cervix was unchanged and she was discharged home with PTL precautions.  Contractions become more frequent and more painful at home and she returned for evaluation.  No cervical change noted over the past 24 hours. Reports bloody show and more painful contractions.        Factors complicating pregnancy: 1. Prior CS 2014  (H/o prior Cesarean Section for cord prolapse during 37-38wk IOL for pre-e. Confirmed LTCS per notes found in care everywhere. Pt desires TOLAC.) 2. Prior Pre-eclampsia  3. BMI 37 4. Abnormal Pap: LSIL, HPV POS 5. Severe Vit D deficiency  6. Cholelithiasis  S: Resting comfortably. no LOF,  Active fetal movement.   Maternal Medical History:  Past Medical Hx:  has a past medical history of Bacteriuria, Chlamydia, Hypertension, and Pregnancy induced hypertension.    Past Surgical Hx:  has a past surgical history that includes Cesarean section (N/A, 11/08/2013) and Cesarean section.   Allergies  Allergen Reactions   Sulfa Antibiotics Hives     Prior to Admission medications   Medication Sig Start Date End Date Taking? Authorizing Provider  cyanocobalamin (,VITAMIN B-12,) 1000 MCG/ML injection Inject 1 mL into muscle every 14 days for 3 months, then once a month for 3 months. 06/06/20   [provider]  Prenatal Vit-Fe Fumarate-FA (PRENATAL MULTIVITAMIN) TABS tablet Take 1 tablet by mouth daily at 12 noon.    [provider]    Social History: She  reports that she has never smoked. She has never used smokeless tobacco. She reports that she does not currently use alcohol. She reports that she does not use drugs.  Family History: family history includes Cancer in her maternal grandfather and maternal grandmother; Dementia in her paternal grandmother; Hypertension in her paternal grandfather; Ovarian cancer in her maternal grandmother; Stroke in her father.   Review of Systems: A full review of systems was performed and negative except as noted in the HPI.    O:  BP 135/81 (BP Location: Left Arm)   Pulse 99   Temp 98.2 F (36.8 C) (Oral)   Resp 16   Ht 5' (1.524 m)   Wt 88.9 kg   LMP 12/16/2020   BMI 38.28 kg/m  Results for orders placed or performed during the hospital encounter of 08/27/21 (from the past 48 hour(s))  CBC with Differential/Platelet   Collection Time: 08/27/21  7:00 AM  Result Value Ref Range   WBC 20.7 (H) 4.0 - 10.5 K/uL   RBC 3.70 (L) 3.87 - 5.11 MIL/uL   Hemoglobin 10.9 (L) 12.0 - 15.0 g/dL   HCT 89.3 (L) 81.0 - 17.5 %   MCV 89.2 80.0 - 100.0 fL   MCH 29.5 26.0 - 34.0 pg   MCHC 33.0 30.0 - 36.0 g/dL   RDW 10.2 58.5 - 27.7 %   Platelets 299 150 - 400 K/uL   nRBC 0.0 0.0 - 0.2 %   Neutrophils Relative % 85 %   Neutro Abs 17.5 (H) 1.7 - 7.7  K/uL   Lymphocytes Relative 8 %   Lymphs Abs 1.6 0.7 - 4.0 K/uL   Monocytes Relative 6 %   Monocytes Absolute 1.3 (H) 0.1 - 1.0 K/uL   Eosinophils Relative 0 %   Eosinophils Absolute 0.0 0.0 - 0.5 K/uL   Basophils Relative 0 %   Basophils Absolute 0.1 0.0 - 0.1 K/uL   Immature Granulocytes 1 %   Abs Immature Granulocytes 0.22 (H) 0.00 - 0.07 K/uL  Type and screen [facility]   Collection Time: 08/27/21  7:00 AM  Result Value Ref Range   ABO/RH(D) B POS    Antibody Screen NEG    Sample Expiration      08/30/2021,2359 Performed at [facility], [address] [place], [place] [address]    Results for orders placed or performed during the hospital encounter of 08/26/21 (from the past 48 hour(s))  Urinalysis, Routine w reflex microscopic Urine, Clean Catch   Collection Time: 08/26/21  5:32 AM  Result Value Ref Range   Color, Urine YELLOW (A) YELLOW   APPearance HAZY (A) CLEAR   Specific Gravity, Urine 1.008 1.005 - 1.030   pH 7.0 5.0 - 8.0   Glucose, UA NEGATIVE NEGATIVE mg/dL   Hgb urine dipstick LARGE (A) NEGATIVE   Bilirubin Urine NEGATIVE NEGATIVE   Ketones, ur 5 (A) NEGATIVE mg/dL   Protein, ur NEGATIVE NEGATIVE mg/dL   Nitrite NEGATIVE NEGATIVE   Leukocytes,Ua LARGE (A) NEGATIVE   RBC / HPF >50 (H) 0 - 5 RBC/hpf   WBC, UA >50 (H) 0 - 5 WBC/hpf   Bacteria, UA NONE SEEN NONE SEEN   Squamous Epithelial / LPF 0-5 0 - 5  Wet prep, genital   Collection Time: 08/26/21  5:33 AM  Result Value Ref Range   Yeast Wet Prep HPF POC NONE SEEN NONE SEEN   Trich, Wet Prep NONE SEEN NONE SEEN   Clue Cells Wet Prep HPF POC NONE SEEN NONE SEEN   WBC, Wet Prep HPF POC MODERATE (A) NONE SEEN   Sperm NONE SEEN   Urine Culture   Collection Time: 08/26/21  5:33 AM   Specimen: Urine, Clean Catch  Result Value Ref Range   Specimen Description      URINE, CLEAN CATCH Performed at [facility], [address], [place], [place] [address]    Special Requests      NONE Performed at [facility], [address] [place], [place] [address]    Culture      CULTURE REINCUBATED FOR BETTER GROWTH Performed at [facility], [address], [place], [place] [address]    Report Status PENDING   Group B strep by PCR   Collection Time: 08/26/21  7:24 AM   Specimen: Vaginal/Rectal; Genital  Result Value Ref Range   Group B strep by PCR NEGATIVE NEGATIVE  Protein / creatinine ratio, urine   Collection Time: 08/26/21  7:47 AM  Result Value Ref Range   Creatinine, Urine 48 mg/dL   Total Protein, Urine 7 mg/dL   Protein Creatinine Ratio 0.15 0.00 - 0.15  mg/mg[Cre]  CBC   Collection Time: 08/26/21  7:54 AM  Result Value Ref Range   WBC 19.0 (H) 4.0 - 10.5 K/uL   RBC 3.47 (L) 3.87 - 5.11 MIL/uL   Hemoglobin 10.2 (L) 12.0 - 15.0 g/dL   HCT 84.1 (L) 66.0 - 63.0 %   MCV 87.6 80.0 - 100.0 fL   MCH 29.4 26.0 -  34.0 pg   MCHC 33.6 30.0 - 36.0 g/dL   RDW 34.1 96.2 - 22.9 %   Platelets 281 150 - 400 K/uL   nRBC 0.0 0.0 - 0.2 %  Comprehensive metabolic panel   Collection Time: 08/26/21  7:54 AM  Result Value Ref Range   Sodium 133 (L) 135 - 145 mmol/L   Potassium 3.5 3.5 - 5.1 mmol/L   Chloride 103 98 - 111 mmol/L   CO2 21 (L) 22 - 32 mmol/L   Glucose, Bld 93 70 - 99 mg/dL   BUN 5 (L) 6 - 20 mg/dL   Creatinine, Ser 7.98 0.44 - 1.00 mg/dL   Calcium 8.6 (L) 8.9 - 10.3 mg/dL   Total Protein 6.3 (L) 6.5 - 8.1 g/dL   Albumin 2.6 (L) 3.5 - 5.0 g/dL   AST 17 15 - 41 U/L   ALT 13 0 - 44 U/L   Alkaline Phosphatase 168 (H) 38 - 126 U/L   Total Bilirubin 0.6 0.3 - 1.2 mg/dL   GFR, Estimated >92 >11 mL/min   Anion gap 9 5 - 15      Constitutional: NAD, AAOx3  HE/ENT: extraocular movements grossly intact, moist mucous membranes CV: RRR PULM: nl respiratory effort, CTABL Abd: gravid, non-tender, non-distended, soft  Ext: Non-tender, Nonedmeatous Psych: mood appropriate, speech normal Pelvic :  small amount of blood-tinged mucus SVE: Dilation: 1 Effacement (%): 50 Cervical Position: Anterior Station: Ballotable Presentation: Vertex Exam by:: Tami Lin, CNM   No cervical change for > 8 hours   Fetal Monitor: Baseline: 145 bpm Variability: moderate Accels: Present Decels: none Toco: Irregular, every 5-10 minutes, mild to palpation   Category: I   Assessment: 25 y.o. [redacted]w[redacted]d here for antenatal surveillance during pregnancy.  Principle diagnosis: threatened preterm labor    Plan: Labor: not present.  Fetal Wellbeing: Reassuring Cat 1 tracing. Reactive NST  Contractions less frequent after IVF bolus with no cervical change over >  8 hours  Vaginal bleeding scant to small - blood tinged mucus Mild range b/p's noted initially, subsequent blood pressures WNL, PreE labs normal We reviewed preterm labor precautions and when to return to the hospital  Pelvic rest  D/c home stable, precautions reviewed, follow-up in office on Monday or Tuesday for b/p check   ----- Margaretmary Eddy, CNM Certified Nurse Midwife [place]  [facility]

## 2021-08-28 ENCOUNTER — Encounter: Payer: Self-pay | Admitting: Obstetrics and Gynecology

## 2021-08-28 ENCOUNTER — Inpatient Hospital Stay: Payer: BC Managed Care – PPO | Admitting: Anesthesiology

## 2021-08-28 ENCOUNTER — Inpatient Hospital Stay
Admission: EM | Admit: 2021-08-28 | Discharge: 2021-08-30 | DRG: 805 | Disposition: A | Discharge status: Home / Self Care | Payer: BC Managed Care – PPO | Attending: Obstetrics | Admitting: Obstetrics

## 2021-08-28 ENCOUNTER — Encounter: Payer: Self-pay | Admitting: Oncology

## 2021-08-28 DIAGNOSIS — O9081 Anemia of the puerperium: Secondary | ICD-10-CM | POA: Diagnosis not present

## 2021-08-28 DIAGNOSIS — O411290 Chorioamnionitis, unspecified trimester, not applicable or unspecified: Secondary | ICD-10-CM | POA: Diagnosis not present

## 2021-08-28 DIAGNOSIS — Z20822 Contact with and (suspected) exposure to covid-19: Secondary | ICD-10-CM | POA: Diagnosis present

## 2021-08-28 DIAGNOSIS — O99214 Obesity complicating childbirth: Secondary | ICD-10-CM | POA: Diagnosis present

## 2021-08-28 DIAGNOSIS — Z3A36 36 weeks gestation of pregnancy: Secondary | ICD-10-CM | POA: Diagnosis not present

## 2021-08-28 DIAGNOSIS — O34211 Maternal care for low transverse scar from previous cesarean delivery: Secondary | ICD-10-CM | POA: Diagnosis present

## 2021-08-28 DIAGNOSIS — O134 Gestational [pregnancy-induced] hypertension without significant proteinuria, complicating childbirth: Principal | ICD-10-CM | POA: Diagnosis present

## 2021-08-28 DIAGNOSIS — O411230 Chorioamnionitis, third trimester, not applicable or unspecified: Secondary | ICD-10-CM | POA: Diagnosis present

## 2021-08-28 DIAGNOSIS — D62 Acute posthemorrhagic anemia: Secondary | ICD-10-CM | POA: Diagnosis not present

## 2021-08-28 DIAGNOSIS — O34219 Maternal care for unspecified type scar from previous cesarean delivery: Secondary | ICD-10-CM | POA: Diagnosis not present

## 2021-08-28 DIAGNOSIS — O4703 False labor before 37 completed weeks of gestation, third trimester: Secondary | ICD-10-CM | POA: Diagnosis present

## 2021-08-28 LAB — COMPREHENSIVE METABOLIC PANEL
ALT: 16 U/L (ref 0–44)
AST: 20 U/L (ref 15–41)
Albumin: 2.7 g/dL — ABNORMAL LOW (ref 3.5–5.0)
Alkaline Phosphatase: 187 U/L — ABNORMAL HIGH (ref 38–126)
Anion gap: 5 (ref 5–15)
BUN: 5 mg/dL — ABNORMAL LOW (ref 6–20)
CO2: 25 mmol/L (ref 22–32)
Calcium: 8.3 mg/dL — ABNORMAL LOW (ref 8.9–10.3)
Chloride: 103 mmol/L (ref 98–111)
Creatinine, Ser: 0.52 mg/dL (ref 0.44–1.00)
GFR, Estimated: >60 mL/min (ref >60)
Glucose, Bld: 110 mg/dL — ABNORMAL HIGH (ref 70–99)
Potassium: 3.6 mmol/L (ref 3.5–5.1)
Sodium: 133 mmol/L — ABNORMAL LOW (ref 135–145)
Total Bilirubin: 0.4 mg/dL (ref 0.3–1.2)
Total Protein: 6.8 g/dL (ref 6.5–8.1)

## 2021-08-28 LAB — RESP PANEL BY RT-PCR (FLU A&B, COVID) ARPGX2
Influenza A by PCR: NEGATIVE
Influenza B by PCR: NEGATIVE
SARS Coronavirus 2 by RT PCR: NEGATIVE

## 2021-08-28 LAB — CBC
HCT: 29.8 % — ABNORMAL LOW (ref 36.0–46.0)
Hemoglobin: 9.9 g/dL — ABNORMAL LOW (ref 12.0–15.0)
MCH: 30 pg (ref 26.0–34.0)
MCHC: 33.2 g/dL (ref 30.0–36.0)
MCV: 90.3 fL (ref 80.0–100.0)
Platelets: 308 10*3/uL (ref 150–400)
RBC: 3.3 MIL/uL — ABNORMAL LOW (ref 3.87–5.11)
RDW: 14.1 % (ref 11.5–15.5)
WBC: 20.9 10*3/uL — ABNORMAL HIGH (ref 4.0–10.5)
nRBC: 0.2 % (ref 0.0–0.2)

## 2021-08-28 LAB — URINALYSIS, COMPLETE (UACMP) WITH MICROSCOPIC
Bilirubin Urine: NEGATIVE
Glucose, UA: NEGATIVE mg/dL
Ketones, ur: NEGATIVE mg/dL
Nitrite: NEGATIVE
Protein, ur: 30 mg/dL — AB
Specific Gravity, Urine: 1.023 (ref 1.005–1.030)
pH: 7 (ref 5.0–8.0)

## 2021-08-28 LAB — TYPE AND SCREEN
ABO/RH(D): B POS
Antibody Screen: NEGATIVE

## 2021-08-28 LAB — PROTEIN / CREATININE RATIO, URINE
Creatinine, Urine: 152 mg/dL
Protein Creatinine Ratio: 0.11 mg/mg{Cre} (ref 0.00–0.15)
Total Protein, Urine: 17 mg/dL

## 2021-08-28 LAB — URINE CULTURE: Culture: 30000 — AB

## 2021-08-28 LAB — RPR: RPR Ser Ql: NONREACTIVE

## 2021-08-28 MED ORDER — DIPHENHYDRAMINE HCL 25 MG PO CAPS: 25.0000 mg | ORAL_CAPSULE | Freq: Four times a day (QID) | ORAL | Status: DC | PRN

## 2021-08-28 MED ORDER — EPHEDRINE 5 MG/ML INJ: 10.0000 mg | INTRAVENOUS | Status: DC | PRN

## 2021-08-28 MED ORDER — ONDANSETRON HCL 4 MG PO TABS: 4.0000 mg | ORAL_TABLET | ORAL | Status: DC | PRN

## 2021-08-28 MED ORDER — LIDOCAINE HCL (PF) 1 % IJ SOLN
INTRAMUSCULAR | Status: AC
  Filled 2021-08-28: qty 30

## 2021-08-28 MED ORDER — ACETAMINOPHEN 325 MG PO TABS
650.0000 mg | ORAL_TABLET | ORAL | Status: DC | PRN
Start: 2021-08-28 — End: 2021-08-30

## 2021-08-28 MED ORDER — LABETALOL HCL 5 MG/ML IV SOLN: 80.0000 mg | INTRAVENOUS | Status: DC | PRN

## 2021-08-28 MED ORDER — SOD CITRATE-CITRIC ACID 500-334 MG/5ML PO SOLN: 30.0000 mL | ORAL | Status: DC | PRN

## 2021-08-28 MED ORDER — LACTATED RINGERS IV SOLN: INTRAVENOUS | Status: DC

## 2021-08-28 MED ORDER — SODIUM CHLORIDE 0.9 % IV SOLN
2.0000 g | Freq: Four times a day (QID) | INTRAVENOUS | Status: DC
  Administered 2021-08-28: 2 g via INTRAVENOUS
  Filled 2021-08-28: qty 2000

## 2021-08-28 MED ORDER — SODIUM CHLORIDE 0.9 % IV SOLN
INTRAVENOUS | Status: DC | PRN
  Administered 2021-08-28 (×3): 5 mL via EPIDURAL

## 2021-08-28 MED ORDER — WITCH HAZEL-GLYCERIN EX PADS
1.0000 "application " | MEDICATED_PAD | CUTANEOUS | Status: DC | PRN
  Administered 2021-08-29: 1 via TOPICAL
  Filled 2021-08-28: qty 100

## 2021-08-28 MED ORDER — DIPHENHYDRAMINE HCL 50 MG/ML IJ SOLN: 12.5000 mg | INTRAMUSCULAR | Status: DC | PRN

## 2021-08-28 MED ORDER — DIBUCAINE (PERIANAL) 1 % EX OINT
1.0000 "application " | TOPICAL_OINTMENT | CUTANEOUS | Status: DC | PRN
  Administered 2021-08-29: 1 via RECTAL
  Filled 2021-08-28: qty 28

## 2021-08-28 MED ORDER — PROMETHAZINE HCL 25 MG/ML IJ SOLN
25.0000 mg | INTRAMUSCULAR | Status: DC | PRN
  Administered 2021-08-28: 25 mg via INTRAMUSCULAR
  Filled 2021-08-28: qty 1

## 2021-08-28 MED ORDER — LABETALOL HCL 5 MG/ML IV SOLN: 20.0000 mg | INTRAVENOUS | Status: DC | PRN

## 2021-08-28 MED ORDER — CALCIUM CARBONATE ANTACID 500 MG PO CHEW
2.0000 | CHEWABLE_TABLET | ORAL | Status: DC | PRN
Start: 2021-08-28 — End: 2021-08-28

## 2021-08-28 MED ORDER — MORPHINE SULFATE (PF) 10 MG/ML IV SOLN
10.0000 mg | INTRAVENOUS | Status: DC | PRN
  Administered 2021-08-28: 10 mg via INTRAMUSCULAR
  Filled 2021-08-28: qty 1

## 2021-08-28 MED ORDER — OXYCODONE HCL 5 MG PO TABS: 10.0000 mg | ORAL_TABLET | ORAL | Status: DC | PRN

## 2021-08-28 MED ORDER — COCONUT OIL OIL
1.0000 "application " | TOPICAL_OIL | Status: DC | PRN
  Administered 2021-08-29: 1 via TOPICAL
  Filled 2021-08-28: qty 120

## 2021-08-28 MED ORDER — LACTATED RINGERS IV SOLN
500.0000 mL | INTRAVENOUS | Status: DC | PRN
  Administered 2021-08-28: 500 mL via INTRAVENOUS

## 2021-08-28 MED ORDER — SIMETHICONE 80 MG PO CHEW: 80.0000 mg | CHEWABLE_TABLET | ORAL | Status: DC | PRN

## 2021-08-28 MED ORDER — OXYTOCIN-SODIUM CHLORIDE 30-0.9 UT/500ML-% IV SOLN
2.5000 [IU]/h | INTRAVENOUS | Status: DC
  Filled 2021-08-28: qty 500

## 2021-08-28 MED ORDER — FERROUS SULFATE 325 (65 FE) MG PO TABS
325.0000 mg | ORAL_TABLET | Freq: Two times a day (BID) | ORAL | Status: DC
  Administered 2021-08-29 – 2021-08-30 (×3): 325 mg via ORAL
  Filled 2021-08-28 (×3): qty 1

## 2021-08-28 MED ORDER — PHENYLEPHRINE 40 MCG/ML (10ML) SYRINGE FOR IV PUSH (FOR BLOOD PRESSURE SUPPORT): 80.0000 ug | PREFILLED_SYRINGE | INTRAVENOUS | Status: DC | PRN

## 2021-08-28 MED ORDER — GENTAMICIN SULFATE 40 MG/ML IJ SOLN
5.0000 mg/kg | INTRAVENOUS | Status: DC
  Administered 2021-08-28: 440 mg via INTRAVENOUS
  Filled 2021-08-28 (×2): qty 11

## 2021-08-28 MED ORDER — OXYCODONE HCL 5 MG PO TABS: 5.0000 mg | ORAL_TABLET | ORAL | Status: DC | PRN

## 2021-08-28 MED ORDER — LACTATED RINGERS IV SOLN: 500.0000 mL | Freq: Once | INTRAVENOUS | Status: AC

## 2021-08-28 MED ORDER — PRENATAL MULTIVITAMIN CH
1.0000 | ORAL_TABLET | Freq: Every day | ORAL | Status: DC
Start: 2021-08-29 — End: 2021-08-30
  Administered 2021-08-29 – 2021-08-30 (×2): 1 via ORAL
  Filled 2021-08-28 (×2): qty 1

## 2021-08-28 MED ORDER — LABETALOL HCL 5 MG/ML IV SOLN: 40.0000 mg | INTRAVENOUS | Status: DC | PRN

## 2021-08-28 MED ORDER — HYDRALAZINE HCL 20 MG/ML IJ SOLN: 10.0000 mg | INTRAMUSCULAR | Status: DC | PRN

## 2021-08-28 MED ORDER — TETANUS-DIPHTH-ACELL PERTUSSIS 5-2.5-18.5 LF-MCG/0.5 IM SUSY: 0.5000 mL | PREFILLED_SYRINGE | Freq: Once | INTRAMUSCULAR | Status: DC

## 2021-08-28 MED ORDER — DOCUSATE SODIUM 100 MG PO CAPS
100.0000 mg | ORAL_CAPSULE | Freq: Two times a day (BID) | ORAL | Status: DC
  Administered 2021-08-29 – 2021-08-30 (×3): 100 mg via ORAL
  Filled 2021-08-28 (×3): qty 1

## 2021-08-28 MED ORDER — ONDANSETRON HCL 4 MG/2ML IJ SOLN: 4.0000 mg | Freq: Four times a day (QID) | INTRAMUSCULAR | Status: DC | PRN

## 2021-08-28 MED ORDER — BENZOCAINE-MENTHOL 20-0.5 % EX AERO
1.0000 "application " | INHALATION_SPRAY | CUTANEOUS | Status: DC | PRN
  Administered 2021-08-29: 1 via TOPICAL
  Filled 2021-08-28: qty 56

## 2021-08-28 MED ORDER — AMMONIA AROMATIC IN INHA
RESPIRATORY_TRACT | Status: AC
  Filled 2021-08-28: qty 10

## 2021-08-28 MED ORDER — ACETAMINOPHEN 500 MG PO TABS
1000.0000 mg | ORAL_TABLET | Freq: Four times a day (QID) | ORAL | Status: DC | PRN
  Administered 2021-08-28: 1000 mg via ORAL
  Filled 2021-08-28: qty 2

## 2021-08-28 MED ORDER — FENTANYL-BUPIVACAINE-NACL 0.5-0.125-0.9 MG/250ML-% EP SOLN
EPIDURAL | Status: AC
  Filled 2021-08-28: qty 250

## 2021-08-28 MED ORDER — BUTORPHANOL TARTRATE 1 MG/ML IJ SOLN: 1.0000 mg | INTRAMUSCULAR | Status: DC | PRN

## 2021-08-28 MED ORDER — ONDANSETRON HCL 4 MG/2ML IJ SOLN
4.0000 mg | INTRAMUSCULAR | Status: DC | PRN
Start: 2021-08-28 — End: 2021-08-30

## 2021-08-28 MED ORDER — FENTANYL-BUPIVACAINE-NACL 0.5-0.125-0.9 MG/250ML-% EP SOLN
EPIDURAL | Status: DC | PRN
  Administered 2021-08-28: 12 mL/h via EPIDURAL

## 2021-08-28 MED ORDER — OXYTOCIN 10 UNIT/ML IJ SOLN
INTRAMUSCULAR | Status: AC
  Filled 2021-08-28: qty 2

## 2021-08-28 MED ORDER — LIDOCAINE-EPINEPHRINE (PF) 1.5 %-1:200000 IJ SOLN
INTRAMUSCULAR | Status: DC | PRN
  Administered 2021-08-28: 3 mL

## 2021-08-28 MED ORDER — MISOPROSTOL 200 MCG PO TABS
ORAL_TABLET | ORAL | Status: AC
  Filled 2021-08-28: qty 4

## 2021-08-28 MED ORDER — FENTANYL-BUPIVACAINE-NACL 0.5-0.125-0.9 MG/250ML-% EP SOLN: 12.0000 mL/h | EPIDURAL | Status: DC | PRN

## 2021-08-28 MED ORDER — OXYTOCIN BOLUS FROM INFUSION
333.0000 mL | Freq: Once | INTRAVENOUS | Status: AC
  Administered 2021-08-28: 333 mL via INTRAVENOUS

## 2021-08-28 MED ORDER — IBUPROFEN 600 MG PO TABS
600.0000 mg | ORAL_TABLET | Freq: Four times a day (QID) | ORAL | Status: DC
  Administered 2021-08-28 – 2021-08-30 (×7): 600 mg via ORAL
  Filled 2021-08-28 (×7): qty 1

## 2021-08-28 MED ORDER — LIDOCAINE HCL (PF) 1 % IJ SOLN: 30.0000 mL | INTRAMUSCULAR | Status: DC | PRN

## 2021-08-28 NOTE — Anesthesia Procedure Notes (Signed)
Epidural Patient location during procedure: OB Start time: 08/28/2021 8:03 AM End time: 08/28/2021 8:23 AM  Staffing Anesthesiologist: Alver Fisher, MD Performed: anesthesiologist   Preanesthetic Checklist Completed: patient identified, IV checked, site marked, risks and benefits discussed, surgical consent, monitors and equipment checked, pre-op evaluation and timeout performed  Epidural Patient position: sitting Prep: ChloraPrep Patient monitoring: heart rate, continuous pulse ox and blood pressure Approach: midline Location: L4-L5 Injection technique: LOR saline  Needle:  Needle type: Tuohy  Needle gauge: 18 G Needle length: 9 cm and 9 Needle insertion depth: 6 cm Catheter type: closed end flexible Catheter size: 20 Guage Catheter at skin depth: 10 cm Test dose: negative (0.125% bupivacaine)  Assessment Events: blood not aspirated, injection not painful, no injection resistance, no paresthesia and negative IV test  Additional Notes   Patient tolerated the insertion well without complications.Reason for block:procedure for pain

## 2021-08-28 NOTE — Progress Notes (Addendum)
S: Reports new onset headache, unsure if water broke.  Felt a gush of fluid but continues to have bloody, pink mucus.   O: BP (!) 149/95 (BP Location: Right Arm)   Pulse (!) 112   Temp 98.4 F (36.9 C) (Oral)   Resp 18   Ht 5' (1.524 m)   Wt 88.9 kg   LMP 12/16/2020   BMI 38.28 kg/m    Baseline: 150bpm Variability: moderate Accels: present Decels: present - prolong decel x 1  Uterine activity: 1-3, mild to palpation   Category: 2->1 after prolong decel   A/P:  -Assessment concerning for increase in frequency in painful contractions despite IV fluids and morphine rest.   -FHR deceleration noted when [place] returned from the bathroom  -Blood pressures now elevated with new onset headache -Differential diagnosis include gestational hypertension, new onset Preeclampsia (labs this AM were normal), abruption, chorioamnionitis, or uterine rupture.   -Reviewed plan of care with Dr. Dalbert Garnet.   -Will plan to move forward with delivery given concerns in assessment and risk of worsening maternal/fetal condition.    Margaretmary Eddy, CNM Certified Nurse Midwife [facility]

## 2021-08-28 NOTE — Progress Notes (Signed)
Pt seen and evaluated, notes delayed due to patient care  On exam, cervix 5cm/100/0 with SROM during exam for thick mec. Fetal hair palpated.  Contractions strong. No vaginal bleeding on my exam just now.  DDx: placental abruption, uterine rupture, preterm labor, chorio., gHTN. - She presented with elevated WBC, but afebrile. FHT not tachy, uterus not tender - No bleeding noted at this time, and uterus is soft and non-tender in between contractions on my exam. - Leopold's cephalic - Currently Cat II strip with mod var and accels. However, decels intermittently - Plan for epidural. - Discussion with patient and partner re: serious concerns as above, and possible need for stat c/s.  - Will continue to monitor BP. PreE labs normal this weekend, 2 severe range pressures with normal in between and one during contractions. Will mag if another severe range pressures. After epidural will monitor for BP. - Continuous fetal monitoring. Anesthesia aware.

## 2021-08-28 NOTE — Discharge Summary (Signed)
Obstetrical Discharge Summary  Patient Name: Doris Lowe DOB: [birth year] MRN: [id number]  Date of Admission: 08/28/2021 Date of Delivery: 08/28/21 Delivered by: Beckey Downing CNM with Dr. Dalbert Garnet in house Date of Discharge: 08/30/2021  Primary OB: [facility]  [id number] last menstrual period was 12/16/2020. EDC Estimated Date of Delivery: 09/22/21 Gestational Age at Delivery: [redacted]w[redacted]d   Antepartum complications:  Prior LTCS for prolapsed cord - desires TOLAC History of preeclampsia  Obesity in pregnancy  Abnormal pap smear  Vitamin D Deficiency  Cholelithiasis   Admitting Diagnosis: PTL Secondary Diagnosis: GHTN, VBAC, chorio Patient Active Problem List   Diagnosis Date Noted   VBAC, delivered 08/28/2021   Chorioamnionitis 08/28/2021   Uterine size date discrepancy, third trimester 08/27/2021   Vaginal bleeding 08/26/2021   Supervision of other high risk pregnancies, second trimester 02/06/2021   Iron deficiency anemia 06/19/2020   Vitamin D deficiency, unspecified 02/05/2017    Augmentation: N/A Complications: Intrauterine Inflammation or infection (Chorioamniotis)  Intrapartum complications/course:  Delivery Type: vaginal birth after cesarean (VBAC) Anesthesia: epidural Placenta: spontaneous Laceration: none Episiotomy: none Newborn Data: Live born female  Birth Weight:  5#6 APGAR: 8/9 "Jaxson"  Newborn Delivery   Birth date/time: 08/28/2021 20:16:00 Delivery type: VBAC, Spontaneous      25 y.o. G3P1011 at [redacted]w[redacted]d presenting with uterine contractions and monitored for active labor, she began to change her cervix and then SROM with MSAF.  She progressed to complete and pushed over an intact perineum and delivered the fetal head, followed promptly by the shoulders. She was in control the whole time, and the baby placed on the maternal abdomen. Delayed cord clamping and the FOB cut the baby's cord, while the baby was skin to skin. The placenta delivered  spontaneously and intact. No laceration noted. Mom and baby tolerated the procedure well.   Postpartum Procedures: elevated BPs persisted in mild range, procardia started on PPD2, remains asymptomatic with negative labs.   Post partum course:  Patient had an uncomplicated postpartum course.  By time of discharge on PPD#2, her pain was controlled on oral pain medications; she had appropriate lochia and was ambulating, voiding without difficulty and tolerating regular diet.  She was deemed stable for discharge to home.    Discharge Physical Exam:  BP (!) 135/92 (BP Location: Left Arm)   Pulse 96   Temp 98.5 F (36.9 C) (Oral)   Resp 18   Ht 5' (1.524 m)   Wt 88.9 kg   LMP 12/16/2020   SpO2 99%   Breastfeeding Unknown   BMI 38.28 kg/m   General: alert and no distress Pulm: normal respiratory effort Lochia: appropriate Abdomen: soft, NT Uterine Fundus: firm, below umbilicus Extremities: No evidence of DVT seen on physical exam. No lower extremity edema. Edinburgh:  Edinburgh Postnatal Depression Scale Screening Tool 08/29/2021 08/29/2021  I have been able to laugh and see the funny side of things. 0 (No Data)  I have looked forward with enjoyment to things. 0 -  I have blamed myself unnecessarily when things went wrong. 1 -  I have been anxious or worried for no good reason. 0 -  I have felt scared or panicky for no good reason. 0 -  Things have been getting on top of me. 1 -  I have been so unhappy that I have had difficulty sleeping. 1 -  I have felt sad or miserable. 1 -  I have been so unhappy that I have been crying. 1 -  The thought of harming myself  has occurred to me. 0 -  Edinburgh Postnatal Depression Scale Total 5 -     Labs: CBC Latest Ref Rng & Units 08/30/2021 08/29/2021 08/28/2021  WBC 4.0 - 10.5 K/uL 14.6(H) 24.6(H) 20.9(H)  Hemoglobin 12.0 - 15.0 g/dL 5.1(V) 8.9(L) 9.9(L)  Hematocrit 36.0 - 46.0 % 26.1(L) 27.3(L) 29.8(L)  Platelets 150 - 400 K/uL 305 288  308   B POS Hemoglobin  Date Value Ref Range Status  08/30/2021 8.9 (L) 12.0 - 15.0 g/dL Final   HCT  Date Value Ref Range Status  08/30/2021 26.1 (L) 36.0 - 46.0 % Final    Disposition: stable, discharge to home Baby Feeding: breastmilk Baby Disposition: home with mom  Contraception: TBD  Prenatal Labs:  Blood type/Rh B pos  Antibody screen neg  Rubella Immune  Varicella Immune  RPR NR  HBsAg Neg  HIV NR  GC neg  Chlamydia neg  Genetic screening negative  1 hour GTT 123  3 hour GTT   GBS neg   Rh Immune globulin given: n/a Rubella vaccine given: Immune Varicella vaccine given: Immune Tdap vaccine given in AP or PP setting: 06/30/21 Flu vaccine given in AP or PP setting: declined  Plan: [name] was discharged to home in good condition. Follow-up appointment with delivering provider in 6 weeks.  Discharge Instructions: Per After Visit Summary. Activity: Advance as tolerated. Pelvic rest for 6 weeks.   Diet: Regular Discharge Medications: Allergies as of 08/30/2021       Reactions   Sulfa Antibiotics Hives        Medication List     TAKE these medications    acetaminophen 325 MG tablet Commonly known as: Tylenol Take 2 tablets (650 mg total) by mouth every 4 (four) hours as needed (for pain scale < 4).   benzocaine-Menthol 20-0.5 % Aero Commonly known as: DERMOPLAST Apply 1 application topically as needed for irritation (perineal discomfort).   coconut oil Oil Apply 1 application topically as needed.   cyanocobalamin 1000 MCG/ML injection Commonly known as: (VITAMIN B-12) Inject 1 mL into muscle every 14 days for 3 months, then once a month for 3 months.   docusate sodium 100 MG capsule Commonly known as: COLACE Take 1 capsule (100 mg total) by mouth 2 (two) times daily.   ferrous sulfate 325 (65 FE) MG tablet Take 1 tablet (325 mg total) by mouth 2 (two) times daily with a meal.   ibuprofen 600 MG tablet Commonly known as:  ADVIL Take 1 tablet (600 mg total) by mouth every 6 (six) hours.   NIFEdipine 30 MG 24 hr tablet Commonly known as: ADALAT CC Take 1 tablet (30 mg total) by mouth daily. Start taking on: August 31, 2021   prenatal multivitamin Tabs tablet Take 1 tablet by mouth daily at 12 noon.   simethicone 80 MG chewable tablet Commonly known as: MYLICON Chew 1 tablet (80 mg total) by mouth as needed for flatulence.   witch hazel-glycerin pad Commonly known as: TUCKS Apply 1 application topically as needed for hemorrhoids.       Outpatient follow up:   Follow-up Information     [facility] Follow up in 2 day(s).   Why: BP check Contact information: [address] [place] [address] [phone]                Signed: Randa Ngo, CNM  08/30/2021 1:50 PM

## 2021-08-28 NOTE — Progress Notes (Signed)
TOLAC Protocol initiated as of 0800 on 10/10. Pt. is G3P1, previous C/S indicated for cord prolapse in 2014. GBS negative. Anesthesia aware.

## 2021-08-28 NOTE — Progress Notes (Signed)
Labor Progress Note  Doris Lowe is a 25 y.o. G3P1011 at [redacted]w[redacted]d by LMP admitted for active labor  Subjective: Feeling more pressure  Objective: BP 116/66 (BP Location: Left Arm)   Pulse (!) 109   Temp 99.6 F (37.6 C) (Axillary)   Resp 15   Ht 5' (1.524 m)   Wt 88.9 kg   LMP 12/16/2020   SpO2 99%   BMI 38.28 kg/m   Fetal Assessment: FHT:  FHR: 150 bpm, variability: moderate,  accelerations:  Present,  decelerations:  Absent Category/reactivity:  Category I UC:   regular, every 1-6 minutes SVE:    Dilation: 10cm  Effacement: 100%  Station:  +1  Consistency: soft  Position: anterior  Membrane status:SROM at 0754 Amniotic color: heavy mec  Labs: Lab Results  Component Value Date   WBC 20.9 (H) 08/28/2021   HGB 9.9 (L) 08/28/2021   HCT 29.8 (L) 08/28/2021   MCV 90.3 08/28/2021   PLT 308 08/28/2021    Assessment / Plan: Spontaneous labor, progressing normally, will start pushing. Dr. Dalbert Garnet made aware.  Labor: Progressing normally Preeclampsia:   Vitals:   08/28/21 0822 08/28/21 0827 08/28/21 0832 08/28/21 0851  BP: 135/88 140/77 130/86 129/74   08/28/21 0933 08/28/21 1033 08/28/21 1133 08/28/21 1332  BP: 127/83 131/84 (!) 107/59 128/83   08/28/21 1432 08/28/21 1532 08/28/21 1632 08/28/21 1732  BP: 130/89 134/84 137/76 116/66   Labs done this am - WNL  Fetal Wellbeing:  Category I Pain Control:  Epidural I/D:   Afebrile, GBS neg, SROM x 10hr Anticipated MOD:  NSVD  [name], CNM 08/28/2021, 6:31 PM

## 2021-08-28 NOTE — Progress Notes (Signed)
Labor Progress Note  Doris Lowe is a 25 y.o. G3P1011 at [redacted]w[redacted]d by LMP admitted for active labor  Subjective: Pt is comfortable with epidural.   Objective: BP 127/83 (BP Location: Left Arm)   Pulse 97   Temp 99 F (37.2 C) (Oral)   Resp 15   Ht 5' (1.524 m)   Wt 88.9 kg   LMP 12/16/2020   SpO2 100%   BMI 38.28 kg/m   Fetal Assessment: FHT:  FHR: 150 bpm, variability: moderate,  accelerations:  Present,  decelerations:  Present earlies and variables Category/reactivity:  Category I UC:   regular, every 1-5 minutes SVE:    Dilation: 5.5cm  Effacement: 90%  Station:  0  Consistency: soft  Position: anterior  Membrane status:SROM at 0754 Amniotic color: heavy mec  Labs: Lab Results  Component Value Date   WBC 20.9 (H) 08/28/2021   HGB 9.9 (L) 08/28/2021   HCT 29.8 (L) 08/28/2021   MCV 90.3 08/28/2021   PLT 308 08/28/2021    Assessment / Plan: Spontaneous labor, progressing normally  Labor: Progressing normally Preeclampsia:   Vitals:   08/28/21 0255 08/28/21 0455 08/28/21 0711 08/28/21 0728  BP: 133/82 135/82 (!) 148/110 (!) 149/95   08/28/21 0743 08/28/21 0758 08/28/21 0820 08/28/21 0822  BP: (!) 146/99 (!) 150/96 136/80 135/88   08/28/21 0827 08/28/21 0832 08/28/21 0851 08/28/21 0933  BP: 140/77 130/86 129/74 127/83   Results for orders placed or performed during the hospital encounter of 08/28/21 (from the past 24 hour(s))  CBC on admission     Status: Abnormal   Collection Time: 08/28/21  2:13 AM  Result Value Ref Range   WBC 20.9 (H) 4.0 - 10.5 K/uL   RBC 3.30 (L) 3.87 - 5.11 MIL/uL   Hemoglobin 9.9 (L) 12.0 - 15.0 g/dL   HCT 97.0 (L) 26.3 - 78.5 %   MCV 90.3 80.0 - 100.0 fL   MCH 30.0 26.0 - 34.0 pg   MCHC 33.2 30.0 - 36.0 g/dL   RDW 88.5 02.7 - 74.1 %   Platelets 308 150 - 400 K/uL   nRBC 0.2 0.0 - 0.2 %  Protein / creatinine ratio, urine     Status: None   Collection Time: 08/28/21  2:13 AM  Result Value Ref Range   Creatinine, Urine 152  mg/dL   Total Protein, Urine 17 mg/dL   Protein Creatinine Ratio 0.11 0.00 - 0.15 mg/mg[Cre]  Comprehensive metabolic panel     Status: Abnormal   Collection Time: 08/28/21  2:41 AM  Result Value Ref Range   Sodium 133 (L) 135 - 145 mmol/L   Potassium 3.6 3.5 - 5.1 mmol/L   Chloride 103 98 - 111 mmol/L   CO2 25 22 - 32 mmol/L   Glucose, Bld 110 (H) 70 - 99 mg/dL   BUN 5 (L) 6 - 20 mg/dL   Creatinine, Ser 2.87 0.44 - 1.00 mg/dL   Calcium 8.3 (L) 8.9 - 10.3 mg/dL   Total Protein 6.8 6.5 - 8.1 g/dL   Albumin 2.7 (L) 3.5 - 5.0 g/dL   AST 20 15 - 41 U/L   ALT 16 0 - 44 U/L   Alkaline Phosphatase 187 (H) 38 - 126 U/L   Total Bilirubin 0.4 0.3 - 1.2 mg/dL   GFR, Estimated >86 >76 mL/min   Anion gap 5 5 - 15   Fetal Wellbeing:  Category I Pain Control:  Epidural I/D:   Afebrile, GBS neg, SROM x 3hr Anticipated MOD:  NSVD  [name], CNM 08/28/2021, 10:44 AM

## 2021-08-28 NOTE — H&P (Signed)
HISTORY AND PHYSICAL NOTE  History of Present Illness: Doris Lowe is a 25 y.o. G3P1011 at [redacted]w[redacted]d observed for threatened preterm labor.  She presented to L&D with complaints of worsening contractions.  Doris Lowe was evaluated in L&D triage yesterday for preterm contracitons and vaginal bleeding.  Contractions decreased in frequency and vaginal bleeding improved.  Her cervix was unchanged and she was discharged home with PTL precautions.  Contractions become more frequent and more painful at home and she returned for evaluation.  Again, there was no cervical change.  She was given IV fluids and morphine/phenergan rest.  Contractions decreased in frequency again and she was discharged home with strict precautions.  The contractions returned last night, stronger and closer together.  She's having to breath through contractions.  No cervical change noted over the past 24 hours. Reports continued bloody show and passing one medium clot at home.   Reports active fetal movement  Contractions: every 2 to 3 minutes LOF/SROM: denies  Vaginal bleeding: small amount of bloody show Fetal presentation is cephalic.  Factors complicating pregnancy:  Prior LTCS for prolapsed cord - desires TOLAC History of preeclampsia  Obesity in pregnancy  Abnormal pap smear  Vitamin D Deficiency  Cholelithiasis   Prenatal care site:  [facility]  Patient Active Problem List   Diagnosis Date Noted   Threatened preterm labor, third trimester 08/27/2021   Uterine size date discrepancy, third trimester 08/27/2021   Vaginal bleeding 08/26/2021   Supervision of other high risk pregnancies, second trimester 02/06/2021   Iron deficiency anemia 06/19/2020   Vitamin D deficiency, unspecified 02/05/2017    Past Medical History:  Diagnosis Date   Bacteriuria    Chlamydia    Hypertension    Pregnancy induced hypertension     Past Surgical History:  Procedure Laterality Date   CESAREAN SECTION N/A 11/08/2013    Procedure: CESAREAN SECTION;  Surgeon: Adam Phenix, MD;  Location: [facility];  Service: Obstetrics;  Laterality: N/A;   CESAREAN SECTION      OB History  Gravida Para Term Preterm AB Living  3 1 1  0 1 1  SAB IAB Ectopic Multiple Live Births  1 0 0 0 1    # Outcome Date GA Lbr Len/2nd Weight Sex Delivery Anes PTL Lv  3 Current           2 SAB 2018          1 Term 11/08/13 [redacted]w[redacted]d  3325 g F CS-LTranv Gen  LIV    Social History:  reports that she has never smoked. She has never used smokeless tobacco. She reports that she does not currently use alcohol. She reports that she does not use drugs.  Family History: family history includes Cancer in her maternal grandfather and maternal grandmother; Dementia in her paternal grandmother; Hypertension in her paternal grandfather; Ovarian cancer in her maternal grandmother; Stroke in her father.  Allergies  Allergen Reactions   Sulfa Antibiotics Hives    Medications Prior to Admission  Medication Sig Dispense Refill Last Dose   cyanocobalamin (,VITAMIN B-12,) 1000 MCG/ML injection Inject 1 mL into muscle every 14 days for 3 months, then once a month for 3 months.      Prenatal Vit-Fe Fumarate-FA (PRENATAL MULTIVITAMIN) TABS tablet Take 1 tablet by mouth daily at 12 noon.       Review of Systems  Constitutional:  Negative for chills, fever and weight loss.  Respiratory:  Negative for cough and shortness of breath.  Cardiovascular:  Negative for chest pain and palpitations.  Gastrointestinal:  Positive for abdominal pain (contractions) and nausea. Negative for diarrhea and vomiting.  Genitourinary:  Negative for dysuria, frequency and urgency.  Musculoskeletal:  Positive for back pain.  Skin:  Negative for rash.  Neurological:  Negative for dizziness, weakness and headaches.  Psychiatric/Behavioral:  Negative for depression. The patient is not nervous/anxious.    Physical Examination: Vitals:  BP 135/82 (BP Location: Left Arm)   Pulse 95    Temp 98.4 F (36.9 C) (Oral)   Resp 16   Ht 5' (1.524 m)   Wt 88.9 kg   LMP 12/16/2020   BMI 38.28 kg/m  General: no acute distress.  HEENT: normocephalic, atraumatic Heart: regular rate & rhythm.  No murmurs/rubs/gallops Lungs: clear to auscultation bilaterally, normal respiratory effort Abdomen: soft, gravid, non-tender;  EFW: 6lbs Pelvic:   External: Normal external female genitalia  Vaginal: small amount of bloody show  Cervix: Dilation: 1 / Effacement (%): 50 / Station: Ballotable    Extremities: non-tender, symmetric, No edema bilaterally.  DTRs: 2+/2+  Neurologic: Alert & oriented x 3.    Membranes: intact FHT:  FHR: 145 bpm, variability: moderate,  accelerations:  Present,  decelerations:  Absent Category/reactivity:  Category I UC:   regular, every 2-4 minutes  Labs:  Results for orders placed or performed during the hospital encounter of 08/28/21 (from the past 24 hour(s))  Resp Panel by RT-PCR (Flu A&B, Covid) Urine, Clean Catch   Collection Time: 08/28/21  2:13 AM   Specimen: Urine, Clean Catch; Nasopharyngeal(NP) swabs in vial transport medium  Result Value Ref Range   SARS Coronavirus 2 by RT PCR NEGATIVE NEGATIVE   Influenza A by PCR NEGATIVE NEGATIVE   Influenza B by PCR NEGATIVE NEGATIVE  CBC on admission   Collection Time: 08/28/21  2:13 AM  Result Value Ref Range   WBC 20.9 (H) 4.0 - 10.5 K/uL   RBC 3.30 (L) 3.87 - 5.11 MIL/uL   Hemoglobin 9.9 (L) 12.0 - 15.0 g/dL   HCT 02.7 (L) 74.1 - 28.7 %   MCV 90.3 80.0 - 100.0 fL   MCH 30.0 26.0 - 34.0 pg   MCHC 33.2 30.0 - 36.0 g/dL   RDW 86.7 67.2 - 09.4 %   Platelets 308 150 - 400 K/uL   nRBC 0.2 0.0 - 0.2 %  Protein / creatinine ratio, urine   Collection Time: 08/28/21  2:13 AM  Result Value Ref Range   Creatinine, Urine 152 mg/dL   Total Protein, Urine 17 mg/dL   Protein Creatinine Ratio 0.11 0.00 - 0.15 mg/mg[Cre]  Urinalysis, Complete w Microscopic Urine, Clean Catch   Collection Time: 08/28/21   2:13 AM  Result Value Ref Range   Color, Urine YELLOW (A) YELLOW   APPearance HAZY (A) CLEAR   Specific Gravity, Urine 1.023 1.005 - 1.030   pH 7.0 5.0 - 8.0   Glucose, UA NEGATIVE NEGATIVE mg/dL   Hgb urine dipstick MODERATE (A) NEGATIVE   Bilirubin Urine NEGATIVE NEGATIVE   Ketones, ur NEGATIVE NEGATIVE mg/dL   Protein, ur 30 (A) NEGATIVE mg/dL   Nitrite NEGATIVE NEGATIVE   Leukocytes,Ua MODERATE (A) NEGATIVE   RBC / HPF 21-50 0 - 5 RBC/hpf   WBC, UA 21-50 0 - 5 WBC/hpf   Bacteria, UA RARE (A) NONE SEEN   Squamous Epithelial / LPF 11-20 0 - 5   Mucus PRESENT   Comprehensive metabolic panel   Collection Time: 08/28/21  2:41 AM  Result Value Ref Range   Sodium 133 (L) 135 - 145 mmol/L   Potassium 3.6 3.5 - 5.1 mmol/L   Chloride 103 98 - 111 mmol/L   CO2 25 22 - 32 mmol/L   Glucose, Bld 110 (H) 70 - 99 mg/dL   BUN 5 (L) 6 - 20 mg/dL   Creatinine, Ser 4.19 0.44 - 1.00 mg/dL   Calcium 8.3 (L) 8.9 - 10.3 mg/dL   Total Protein 6.8 6.5 - 8.1 g/dL   Albumin 2.7 (L) 3.5 - 5.0 g/dL   AST 20 15 - 41 U/L   ALT 16 0 - 44 U/L   Alkaline Phosphatase 187 (H) 38 - 126 U/L   Total Bilirubin 0.4 0.3 - 1.2 mg/dL   GFR, Estimated >62 >22 mL/min   Anion gap 5 5 - 15  Type and screen   Collection Time: 08/28/21  2:41 AM  Result Value Ref Range   ABO/RH(D) B POS    Antibody Screen NEG    Sample Expiration      08/31/2021,2359 Performed at [facility], [address] [place], [place] [address]     Prenatal Labs: Blood type/Rh B Pos  Antibody screen neg  Rubella Immune  Varicella Immune  RPR NR  HBsAg Neg  HIV NR  GC neg  Chlamydia neg  Genetic screening cfDNA negative  1 hour GTT 123  3 hour GTT N/A  GBS Neg    Imaging Studies: US OB Limited  Result Date: 08/26/2021 CLINICAL DATA:  25 year old female with history of vaginal bleeding and contractions. EXAM: LIMITED OBSTETRIC ULTRASOUND FINDINGS: Number of Fetuses: 1 Heart Rate:  150 bpm Movement: Present  Presentation: Cephalic Previa: None Placental Location: Fundal Amniotic Fluid (Subjective): Normal AFI 9.7 cm (5%ile= 7.7 cm) BPD:  8.8cm 35w 5d Maternal Findings: Cervix:  Closed, approximately 2.9 cm in length Uterus/Adnexae: No abnormality visualized. IMPRESSION: 1. Limited OB ultrasound demonstrating a single viable IUP with normal fetal heart rate of 150 beats per minute, as above. Electronically Signed   By: Trudie Reed M.D.   On: 08/26/2021 08:22    Assessment and Plan: Patient Active Problem List   Diagnosis Date Noted   Threatened preterm labor, third trimester 08/27/2021   Uterine size date discrepancy, third trimester 08/27/2021   Vaginal bleeding 08/26/2021   Supervision of other high risk pregnancies, second trimester 02/06/2021   Iron deficiency anemia 06/19/2020   Vitamin D deficiency, unspecified 02/05/2017    1. Admit to Antenatal -Clear liquid diet -Cat 1 tracing - reassuring fetal tracing  -Therapeutic rest with IM morphine/phenergan  -Plan to monitor for preterm labor -Desires TOLAC if labor progresses  -Dr. Dalbert Garnet updated on plan of care -Routine antenatal care  Gustavo Lah, CNM  Certified Nurse Midwife [place]  [facility]

## 2021-08-28 NOTE — OB Triage Note (Signed)
Pt [name] 25 y.o. is a G3P1011 at [redacted]w[redacted]d. She presents to labor and delivery triage reporting contractions q2-3 minutes rating them as 9/10 on pain scale. Pt stated that at home she passed a 5cm by 2cm blood clot. Pt denies signs and symptoms consistent with rupture of membranes or active vaginal bleeding. Pt states positive fetal movement. External FM and TOCO applied to non-tender abdomen and assessing. Initial FHR 160. Vital signs obtained and within normal limits. Provider notified of pt.

## 2021-08-28 NOTE — Anesthesia Preprocedure Evaluation (Signed)
Anesthesia Evaluation  Patient identified by MRN, date of birth, ID band Patient awake    Reviewed: Allergy & Precautions, NPO status , Patient's Chart, lab work & pertinent test results  History of Anesthesia Complications Negative for: history of anesthetic complications  Airway Mallampati: III  TM Distance: >3 FB Neck ROM: Full    Dental no notable dental hx.    Pulmonary neg pulmonary ROS, neg sleep apnea, neg COPD,    breath sounds clear to auscultation- rhonchi (-) wheezing      Cardiovascular Exercise Tolerance: Good hypertension, (-) CAD, (-) Past MI, (-) Cardiac Stents and (-) CABG  Rhythm:Regular Rate:Normal - Systolic murmurs and - Diastolic murmurs    Neuro/Psych neg Seizures negative neurological ROS  negative psych ROS   GI/Hepatic negative GI ROS, Neg liver ROS,   Endo/Other  negative endocrine ROSneg diabetes  Renal/GU negative Renal ROS     Musculoskeletal negative musculoskeletal ROS (+)   Abdominal (+) + obese, Gravid abdomen  Peds  Hematology  (+) anemia ,   Anesthesia Other Findings Past Medical History: No date: Bacteriuria No date: Chlamydia No date: Hypertension No date: Pregnancy induced hypertension   Reproductive/Obstetrics (+) Pregnancy                             Lab Results  Component Value Date   WBC 20.9 (H) 08/28/2021   HGB 9.9 (L) 08/28/2021   HCT 29.8 (L) 08/28/2021   MCV 90.3 08/28/2021   PLT 308 08/28/2021    Anesthesia Physical Anesthesia Plan  ASA: 2  Anesthesia Plan: Epidural   Post-op Pain Management:    Induction:   PONV Risk Score and Plan: 2  Airway Management Planned:   Additional Equipment:   Intra-op Plan:   Post-operative Plan:   Informed Consent: I have reviewed the patients History and Physical, chart, labs and discussed the procedure including the risks, benefits and alternatives for the proposed anesthesia with  the patient or authorized representative who has indicated his/her understanding and acceptance.       Plan Discussed with: CRNA and Anesthesiologist  Anesthesia Plan Comments: (Plan for epidural for labor, discussed epidural vs spinal vs GA if need for csection)        Anesthesia Quick Evaluation

## 2021-08-28 NOTE — Progress Notes (Signed)
Labor Progress Note  Doris Lowe is a 25 y.o. G3P1011 at [redacted]w[redacted]d by LMP admitted for active labor  Subjective: Pt is comfortable with epidural and feeling intermittent vaginal pressure.  Objective: BP 134/84 (BP Location: Left Arm)   Pulse (!) 119   Temp (!) 100.9 F (38.3 C) (Axillary)   Resp 16   Ht 5' (1.524 m)   Wt 88.9 kg   LMP 12/16/2020   SpO2 97%   BMI 38.28 kg/m   Fetal Assessment: FHT:  FHR: 150 bpm, variability: moderate,  accelerations:  Present,  decelerations:  Absent Category/reactivity:  Category I UC:   regular, every 2-4 minutes SVE:    Dilation: 9.5cm  Effacement: 100%  Station:  0  Consistency: soft  Position: anterior  Membrane status:SROM at 0754 Amniotic color: heavy mec  Labs: Lab Results  Component Value Date   WBC 20.9 (H) 08/28/2021   HGB 9.9 (L) 08/28/2021   HCT 29.8 (L) 08/28/2021   MCV 90.3 08/28/2021   PLT 308 08/28/2021    Assessment / Plan: Spontaneous labor, progressing normally  Labor: Progressing normally Preeclampsia:   Vitals:   08/28/21 0758 08/28/21 0820 08/28/21 0822 08/28/21 0827  BP: (!) 150/96 136/80 135/88 140/77   08/28/21 0832 08/28/21 0851 08/28/21 0933 08/28/21 1033  BP: 130/86 129/74 127/83 131/84   08/28/21 1133 08/28/21 1332 08/28/21 1432 08/28/21 1532  BP: (!) 107/59 128/83 130/89 134/84   Labs done this am - WNL  Fetal Wellbeing:  Category I Pain Control:  Epidural I/D:   Afebrile, GBS neg, SROM x 9hr Anticipated MOD:  NSVD  Doris Lowe Doris Lowe, CNM 08/28/2021, 5:06 PM

## 2021-08-28 NOTE — Progress Notes (Signed)
Labor Progress Note  Doris Lowe is a 25 y.o. G3P1011 at [redacted]w[redacted]d by LMP admitted for active labor  Subjective: Pt is comfortable with epidural. Was able to get a good nap  Objective: BP (!) 107/59 (BP Location: Left Arm)   Pulse (!) 102   Temp 99.3 F (37.4 C) (Axillary)   Resp 16   Ht 5' (1.524 m)   Wt 88.9 kg   LMP 12/16/2020   SpO2 98%   BMI 38.28 kg/m   Fetal Assessment: FHT:  FHR: 145 bpm, variability: moderate,  accelerations:  Present,  decelerations:  Absent Category/reactivity:  Category I UC:   regular, every 2-4 minutes SVE:    Dilation: 8cm  Effacement: 100%  Station:  0  Consistency: soft  Position: anterior  Membrane status:SROM at 0754 Amniotic color: heavy mec  Labs: Lab Results  Component Value Date   WBC 20.9 (H) 08/28/2021   HGB 9.9 (L) 08/28/2021   HCT 29.8 (L) 08/28/2021   MCV 90.3 08/28/2021   PLT 308 08/28/2021    Assessment / Plan: Spontaneous labor, progressing normally  Labor: Progressing normally Preeclampsia:   Vitals:   08/28/21 0711 08/28/21 0728 08/28/21 0743 08/28/21 0758  BP: (!) 148/110 (!) 149/95 (!) 146/99 (!) 150/96   08/28/21 0820 08/28/21 0822 08/28/21 0827 08/28/21 0832  BP: 136/80 135/88 140/77 130/86   08/28/21 0851 08/28/21 0933 08/28/21 1033 08/28/21 1133  BP: 129/74 127/83 131/84 (!) 107/59   Labs done this am - WNL  Fetal Wellbeing:  Category I Pain Control:  Epidural I/D:   Afebrile, GBS neg, SROM x 5hr Anticipated MOD:  NSVD  [name], CNM 08/28/2021, 12:36 PM

## 2021-08-29 LAB — CBC
HCT: 27.3 % — ABNORMAL LOW (ref 36.0–46.0)
Hemoglobin: 8.9 g/dL — ABNORMAL LOW (ref 12.0–15.0)
MCH: 28.3 pg (ref 26.0–34.0)
MCHC: 32.6 g/dL (ref 30.0–36.0)
MCV: 86.9 fL (ref 80.0–100.0)
Platelets: 288 10*3/uL (ref 150–400)
RBC: 3.14 MIL/uL — ABNORMAL LOW (ref 3.87–5.11)
RDW: 14.2 % (ref 11.5–15.5)
WBC: 24.6 10*3/uL — ABNORMAL HIGH (ref 4.0–10.5)
nRBC: 0 % (ref 0.0–0.2)

## 2021-08-29 NOTE — Anesthesia Postprocedure Evaluation (Signed)
Anesthesia Post Note  Patient: Quarry manager  Procedure(s) Performed: AN AD HOC LABOR EPIDURAL  Patient location during evaluation: Mother Baby Anesthesia Type: Epidural Level of consciousness: awake Pain management: pain level controlled Respiratory status: spontaneous breathing Cardiovascular status: stable Postop Assessment: no headache Anesthetic complications: no   No notable events documented.   Last Vitals:  Vitals:   08/29/21 0016 08/29/21 0400  BP: 130/88 113/70  Pulse: 96 (!) 105  Resp: 18 (!) 22  Temp: 37.3 C 36.6 C  SpO2: 98% 98%    Last Pain:  Vitals:   08/29/21 0547  TempSrc:   PainSc: 5                  Jaye Beagle

## 2021-08-29 NOTE — Lactation Note (Signed)
This note was copied from a baby's chart. Lactation Consultation Note  Patient Name: Doris Lowe [id number] Date: 08/29/2021 Reason for consult: Follow-up assessment Age:27 hours  Lactation visit with P2 mom who delivered VBAC 13hrs ago. Baby was born at [redacted]w[redacted]d, less than 6lbs, with troubling initial glucose levels- now resolved.  Pediatrician has recommended baby to breastfeed+be given a supplement every 3-4 hours.  Lactation has worked with mom at positioning and attempted latching of newborn. Nipples do appear flat, however can evert with some stimulation. Baby not overly eager, would latch but remain asleep at the breast. LC educated mom on newborn behaviors, sleepiness of pre-term babies, and importance of frequent feeding attempts. LC supported and assisted mom with supplementation of 46mL formula via bottle/nipple (mom's preference), and taught paced-bottle feeding.   Mom has been set-up with a DEBP at bedside, educated on use/breakdown/cleaning, and importance of frequent stimulation every 3 hours by Lactation Student. Mom used pump with Lactation team at bedside, no concerns voiced during pumping; some drops expressed.   Encouraged next feeding attempt, supplementation, and pumping to be 3hrs since the last feeding; family voiced understanding.  Maternal Data Has patient been taught Hand Expression?: Yes Does the patient have breastfeeding experience prior to this delivery?: Yes How long did the patient breastfeed?: 2weeks  Feeding Mother's Current Feeding Choice: Breast Milk  LATCH Score Latch: Repeated attempts needed to sustain latch, nipple held in mouth throughout feeding, stimulation needed to elicit sucking reflex.  Audible Swallowing: None  Type of Nipple: Flat  Comfort (Breast/Nipple): Soft / non-tender  Hold (Positioning): Assistance needed to correctly position infant at breast and maintain latch.  LATCH Score: 5   Lactation Tools Discussed/Used Tools:  Pump Breast pump type: Double-Electric Breast Pump Pump Education: Setup, frequency, and cleaning;Milk Storage Reason for Pumping: 36wk protocol Pumping frequency: q 3hrs  Interventions Interventions: Education;DEBP  Discharge Pump: Personal  Consult Status Consult Status: Follow-up Date: 08/29/21 Follow-up type: In-patient    [name] 08/29/2021, 12:10 PM

## 2021-08-29 NOTE — Progress Notes (Signed)
Postpartum Day  1  Subjective: no complaints, up ad lib, voiding, tolerating PO, and + flatus  Doing well, no concerns. Ambulating without difficulty, pain managed with PO meds, tolerating regular diet, and voiding without difficulty.   No fever/chills, chest pain, shortness of breath, nausea/vomiting, or leg pain. No nipple or breast pain. No headache, visual changes, or RUQ/epigastric pain.  Objective: BP 113/70 (BP Location: Left Arm)   Pulse (!) 105   Temp 97.8 F (36.6 C) (Oral)   Resp (!) 22   Ht 5' (1.524 m)   Wt 88.9 kg   LMP 12/16/2020   SpO2 98%   Breastfeeding Unknown   BMI 38.28 kg/m    Physical Exam:  General: alert, cooperative, and appears stated age Breasts: soft/nontender CV: RRR Pulm: nl effort, CTABL Abdomen: soft, non-tender, active bowel sounds Uterine Fundus: firm Perineum: minimal edema, intact Lochia: appropriate DVT Evaluation: No evidence of DVT seen on physical exam. Negative Homan's sign.  Recent Labs    08/28/21 0213 08/29/21 0459  HGB 9.9* 8.9*  HCT 29.8* 27.3*  WBC 20.9* 24.6*  PLT 308 288    Assessment/Plan: 25 y.o. [id number] postpartum day # 1  -Continue routine postpartum care -Lactation consult PRN for breastfeeding  -Discussed contraceptive options including implant, IUDs hormonal and non-hormonal, injection, pills/ring/patch, condoms, and NFP.  -Acute blood loss anemia - hemodynamically stable and asymptomatic; start PO ferrous sulfate BID with stool softeners  -Immunization status:   all immunizations up to date -Patient desires husband to get vasectomy, but will do pop's on discharge  Disposition: Continue inpatient postpartum care    LOS: 1 day    ----- Chari Manning Certified Nurse Midwife [facility]

## 2021-08-30 ENCOUNTER — Other Ambulatory Visit: Payer: Self-pay

## 2021-08-30 ENCOUNTER — Encounter: Payer: Self-pay | Admitting: Oncology

## 2021-08-30 LAB — CBC
HCT: 26.1 % — ABNORMAL LOW (ref 36.0–46.0)
Hemoglobin: 8.9 g/dL — ABNORMAL LOW (ref 12.0–15.0)
MCH: 30.2 pg (ref 26.0–34.0)
MCHC: 34.1 g/dL (ref 30.0–36.0)
MCV: 88.5 fL (ref 80.0–100.0)
Platelets: 305 10*3/uL (ref 150–400)
RBC: 2.95 MIL/uL — ABNORMAL LOW (ref 3.87–5.11)
RDW: 14.2 % (ref 11.5–15.5)
WBC: 14.6 10*3/uL — ABNORMAL HIGH (ref 4.0–10.5)
nRBC: 0.1 % (ref 0.0–0.2)

## 2021-08-30 LAB — SURGICAL PATHOLOGY

## 2021-08-30 LAB — COMPREHENSIVE METABOLIC PANEL
ALT: 14 U/L (ref 0–44)
AST: 21 U/L (ref 15–41)
Albumin: 2.4 g/dL — ABNORMAL LOW (ref 3.5–5.0)
Alkaline Phosphatase: 139 U/L — ABNORMAL HIGH (ref 38–126)
Anion gap: 8 (ref 5–15)
BUN: 7 mg/dL (ref 6–20)
CO2: 25 mmol/L (ref 22–32)
Calcium: 8.9 mg/dL (ref 8.9–10.3)
Chloride: 103 mmol/L (ref 98–111)
Creatinine, Ser: 0.46 mg/dL (ref 0.44–1.00)
GFR, Estimated: >60 mL/min (ref >60)
Glucose, Bld: 114 mg/dL — ABNORMAL HIGH (ref 70–99)
Potassium: 3.8 mmol/L (ref 3.5–5.1)
Sodium: 136 mmol/L (ref 135–145)
Total Bilirubin: 0.3 mg/dL (ref 0.3–1.2)
Total Protein: 6 g/dL — ABNORMAL LOW (ref 6.5–8.1)

## 2021-08-30 MED ORDER — NIFEDIPINE ER OSMOTIC RELEASE 30 MG PO TB24
30.0000 mg | ORAL_TABLET | Freq: Every day | ORAL | Status: DC
  Administered 2021-08-30: 30 mg via ORAL
  Filled 2021-08-30: qty 1

## 2021-08-30 MED ORDER — SIMETHICONE 80 MG PO CHEW
80.0000 mg | CHEWABLE_TABLET | ORAL | 0 refills | Status: DC | PRN
  Filled 2021-08-30: qty 30, fill #0

## 2021-08-30 MED ORDER — NIFEDIPINE ER 30 MG PO TB24
30.0000 mg | ORAL_TABLET | Freq: Every day | ORAL | 0 refills | Status: DC
Start: 2021-08-31 — End: 2022-08-17
  Filled 2021-08-30: qty 30, 30d supply, fill #0

## 2021-08-30 MED ORDER — WITCH HAZEL-GLYCERIN EX PADS: 1.0000 "application " | MEDICATED_PAD | CUTANEOUS | 12 refills | Status: DC | PRN

## 2021-08-30 MED ORDER — FERROUS SULFATE 325 (65 FE) MG PO TABS
325.0000 mg | ORAL_TABLET | Freq: Two times a day (BID) | ORAL | 0 refills | Status: DC
  Filled 2021-08-30: qty 60, 30d supply, fill #0

## 2021-08-30 MED ORDER — ACETAMINOPHEN 325 MG PO TABS: 650.0000 mg | ORAL_TABLET | ORAL | Status: AC | PRN

## 2021-08-30 MED ORDER — BENZOCAINE-MENTHOL 20-0.5 % EX AERO: 1.0000 "application " | INHALATION_SPRAY | CUTANEOUS | Status: DC | PRN

## 2021-08-30 MED ORDER — COCONUT OIL OIL
1.0000 | TOPICAL_OIL | 0 refills | Status: DC | PRN
Start: 2021-08-30 — End: 2022-08-17

## 2021-08-30 MED ORDER — DOCUSATE SODIUM 100 MG PO CAPS
100.0000 mg | ORAL_CAPSULE | Freq: Two times a day (BID) | ORAL | 0 refills | Status: DC
  Filled 2021-08-30: qty 30, 15d supply, fill #0

## 2021-08-30 MED ORDER — IBUPROFEN 600 MG PO TABS
600.0000 mg | ORAL_TABLET | Freq: Four times a day (QID) | ORAL | 0 refills | Status: DC
  Filled 2021-08-30: qty 30, 8d supply, fill #0

## 2021-08-30 NOTE — Progress Notes (Signed)
Pt d/c home in the company of baby and significant other. D/c instructions reviewed and pt verbalized understanding. Iv d/c'd with catheter intact. Pt stated she has no further questions.

## 2021-08-30 NOTE — Lactation Note (Signed)
This note was copied from a baby's chart. Lactation Consultation Note  Patient Name: Doris Lowe [id number] Date: 08/30/2021 Reason for consult: Follow-up assessment;Late-preterm 34-36.6wks;Infant < 6lbs;Other (Comment) (IUGR) Age:27 hours  Lactation follow-up; family plans on discharge later today. Baby received circumcision this morning. Over night mom pumped while Dad provided formula via bottle, volumes varying.  Mom asleep this morning during follow-up; dad answered the questions. They have a brand new EBP at home, and dad believes they will continue with the pumping pattern and efforts along with formula supplementation. No concerns voiced about bottle feeding.  LC encouraged consistency with pumping every 3 hours, and information given to call with questions/concerns once they are at home.  Baby has to pass carseat test before discharge.  Maternal Data Has patient been taught Hand Expression?: Yes Does the patient have breastfeeding experience prior to this delivery?: Yes How long did the patient breastfeed?: 2 weeks  Feeding Mother's Current Feeding Choice: Breast Milk  LATCH Score                    Lactation Tools Discussed/Used Tools: Pump Breast pump type: Double-Electric Breast Pump Pump Education: Setup, frequency, and cleaning;Milk Storage Reason for Pumping: mom's choice; 36wk protocol Pumping frequency: q 3hrs  Interventions Interventions: Education  Discharge Discharge Education: Outpatient recommendation Pump: Personal  Consult Status Consult Status: Complete Date: 08/30/21 Follow-up type: Call as needed    [name] 08/30/2021, 10:43 AM

## 2021-09-02 LAB — AEROBIC/ANAEROBIC CULTURE W GRAM STAIN (SURGICAL/DEEP WOUND): Gram Stain: NONE SEEN

## 2021-09-04 ENCOUNTER — Encounter: Payer: Self-pay | Admitting: Oncology

## 2021-09-06 ENCOUNTER — Encounter: Payer: Self-pay | Admitting: Oncology

## 2021-09-07 ENCOUNTER — Encounter: Payer: Self-pay | Admitting: Oncology

## 2021-10-01 HISTORY — PX: CHOLECYSTECTOMY, LAPAROSCOPIC: SHX56

## 2021-11-19 NOTE — L&D Delivery Note (Signed)
Delivery Note  Date of delivery: 09/16/2022 Estimated Date of Delivery: 10/06/22 No LMP recorded. Patient is pregnant. EGA: [redacted]w[redacted]d  Delivery Note At 9:46 PM a viable female was delivered via Vaginal, Spontaneous (Presentation: Right Occiput Anterior).  APGAR: 7, 8; weight 3040g (6lb11.2oz).  Placenta status: Spontaneous, Intact.  Cord: 3 vessels with the following complications: None.  Cord pH: n/a  First Stage: Labor onset: 1315 Augmentation : Pitocin, catheter balloon, AROM Analgesia Lorriane Shire intrapartum: Epidural AROM at [employer] presented to L&D with GHTN. She was augmented with pitocin. Epidural placed for pain relief.   Second Stage: Complete dilation at 1920 Onset of pushing at 1920 FHR second stage Cat I / Cat II Delivery at 2145 on 09/16/2022  She progressed to complete and had a spontaneous vaginal birth of a live female over an intact perineum. The fetal head was delivered in OA position with restitution to ROA. No nuchal cord. Anterior then posterior shoulders delivered spontaneously. Baby placed on mom's abdomen and attended to by transition RN. Cord clamped and cut when pulseless by FOB.   Third Stage: Placenta delivered intact with 3VC at  Placenta disposition: routine disposal Uterine tone firm / bleeding min IV pitocin given for hemorrhage prophylaxis  Anesthesia: Epidural Episiotomy: None Lacerations: None Suture Repair: n/a Est. Blood Loss (mL):  952  Complications: none  Mom to postpartum.  Baby to Couplet care / Skin to Skin.  Newborn: Birth Weight:  3040g (6lb11.2oz).  Apgar Scores: 7, 8 Feeding planned: Breastfeeding   Clydene Laming, CNM 09/16/2022 10:30 PM

## 2022-02-02 LAB — OB RESULTS CONSOLE GC/CHLAMYDIA
Chlamydia: NEGATIVE
Neisseria Gonorrhea: NEGATIVE

## 2022-04-05 LAB — OB RESULTS CONSOLE VARICELLA ZOSTER ANTIBODY, IGG: Varicella: IMMUNE

## 2022-04-05 LAB — OB RESULTS CONSOLE HEPATITIS B SURFACE ANTIGEN: Hepatitis B Surface Ag: NEGATIVE

## 2022-04-05 LAB — OB RESULTS CONSOLE HIV ANTIBODY (ROUTINE TESTING): HIV: NONREACTIVE

## 2022-04-05 LAB — OB RESULTS CONSOLE RUBELLA ANTIBODY, IGM: Rubella: IMMUNE

## 2022-05-28 LAB — OB RESULTS CONSOLE GC/CHLAMYDIA
Chlamydia: NEGATIVE
Neisseria Gonorrhea: NEGATIVE

## 2022-08-10 ENCOUNTER — Other Ambulatory Visit: Payer: Self-pay | Admitting: Obstetrics and Gynecology

## 2022-08-10 DIAGNOSIS — O99013 Anemia complicating pregnancy, third trimester: Secondary | ICD-10-CM

## 2022-08-14 ENCOUNTER — Encounter: Payer: Self-pay | Admitting: Oncology

## 2022-08-17 ENCOUNTER — Ambulatory Visit
Admission: RE | Admit: 2022-08-17 | Discharge: 2022-08-17 | Disposition: A | Discharge status: Home / Self Care | Payer: Medicaid Other | Source: Ambulatory Visit | Attending: Certified Nurse Midwife | Admitting: Certified Nurse Midwife

## 2022-08-17 DIAGNOSIS — D509 Iron deficiency anemia, unspecified: Secondary | ICD-10-CM | POA: Diagnosis not present

## 2022-08-17 DIAGNOSIS — O99013 Anemia complicating pregnancy, third trimester: Secondary | ICD-10-CM | POA: Insufficient documentation

## 2022-08-17 HISTORY — DX: Iron deficiency anemia, unspecified: D50.9

## 2022-08-17 MED ORDER — SODIUM CHLORIDE 0.9 % IV SOLN
300.0000 mg | INTRAVENOUS | Status: DC
  Administered 2022-08-17: 300 mg via INTRAVENOUS
  Filled 2022-08-17: qty 15

## 2022-08-24 ENCOUNTER — Ambulatory Visit
Admission: RE | Admit: 2022-08-24 | Discharge: 2022-08-24 | Disposition: A | Discharge status: Home / Self Care | Payer: Medicaid Other | Source: Ambulatory Visit | Attending: Certified Nurse Midwife | Admitting: Certified Nurse Midwife

## 2022-08-24 DIAGNOSIS — Z3A33 33 weeks gestation of pregnancy: Secondary | ICD-10-CM | POA: Diagnosis not present

## 2022-08-24 DIAGNOSIS — O99013 Anemia complicating pregnancy, third trimester: Secondary | ICD-10-CM | POA: Diagnosis present

## 2022-08-24 MED ORDER — SODIUM CHLORIDE 0.9 % IV SOLN
300.0000 mg | Freq: Once | INTRAVENOUS | Status: AC
  Administered 2022-08-24: 300 mg via INTRAVENOUS
  Filled 2022-08-24: qty 300

## 2022-08-31 ENCOUNTER — Ambulatory Visit
Admission: RE | Admit: 2022-08-31 | Discharge: 2022-08-31 | Disposition: A | Discharge status: Home / Self Care | Payer: Medicaid Other | Source: Ambulatory Visit | Attending: Certified Nurse Midwife | Admitting: Certified Nurse Midwife

## 2022-08-31 DIAGNOSIS — D509 Iron deficiency anemia, unspecified: Secondary | ICD-10-CM | POA: Insufficient documentation

## 2022-08-31 DIAGNOSIS — O99019 Anemia complicating pregnancy, unspecified trimester: Secondary | ICD-10-CM | POA: Insufficient documentation

## 2022-08-31 MED ORDER — SODIUM CHLORIDE FLUSH 0.9 % IV SOLN
INTRAVENOUS | Status: AC
  Filled 2022-08-31: qty 10

## 2022-08-31 MED ORDER — SODIUM CHLORIDE 0.9 % IV SOLN
300.0000 mg | Freq: Once | INTRAVENOUS | Status: AC
  Administered 2022-08-31: 300 mg via INTRAVENOUS
  Filled 2022-08-31: qty 300

## 2022-09-07 ENCOUNTER — Ambulatory Visit
Admission: RE | Admit: 2022-09-07 | Discharge: 2022-09-07 | Disposition: A | Discharge status: Home / Self Care | Payer: Medicaid Other | Source: Ambulatory Visit | Attending: Certified Nurse Midwife | Admitting: Certified Nurse Midwife

## 2022-09-07 DIAGNOSIS — Z01818 Encounter for other preprocedural examination: Secondary | ICD-10-CM | POA: Diagnosis not present

## 2022-09-07 DIAGNOSIS — O99013 Anemia complicating pregnancy, third trimester: Secondary | ICD-10-CM | POA: Diagnosis not present

## 2022-09-07 MED ORDER — SODIUM CHLORIDE 0.9 % IV SOLN
300.0000 mg | Freq: Once | INTRAVENOUS | Status: AC
  Administered 2022-09-07: 300 mg via INTRAVENOUS
  Filled 2022-09-07: qty 300

## 2022-09-11 ENCOUNTER — Other Ambulatory Visit: Payer: Self-pay

## 2022-09-11 ENCOUNTER — Observation Stay
Admission: EM | Admit: 2022-09-11 | Discharge: 2022-09-11 | Disposition: A | Discharge status: Home / Self Care | Payer: Medicaid Other | Attending: Certified Nurse Midwife | Admitting: Certified Nurse Midwife

## 2022-09-11 DIAGNOSIS — O163 Unspecified maternal hypertension, third trimester: Secondary | ICD-10-CM | POA: Diagnosis present

## 2022-09-11 DIAGNOSIS — O133 Gestational [pregnancy-induced] hypertension without significant proteinuria, third trimester: Principal | ICD-10-CM | POA: Insufficient documentation

## 2022-09-11 DIAGNOSIS — Z3A36 36 weeks gestation of pregnancy: Secondary | ICD-10-CM | POA: Diagnosis not present

## 2022-09-11 DIAGNOSIS — Z3490 Encounter for supervision of normal pregnancy, unspecified, unspecified trimester: Secondary | ICD-10-CM

## 2022-09-11 LAB — PROTEIN / CREATININE RATIO, URINE
Creatinine, Urine: 62 mg/dL
Total Protein, Urine: <6 mg/dL

## 2022-09-11 LAB — COMPREHENSIVE METABOLIC PANEL
ALT: 20 U/L (ref 0–44)
AST: 22 U/L (ref 15–41)
Albumin: 3 g/dL — ABNORMAL LOW (ref 3.5–5.0)
Alkaline Phosphatase: 149 U/L — ABNORMAL HIGH (ref 38–126)
Anion gap: 8 (ref 5–15)
BUN: 8 mg/dL (ref 6–20)
CO2: 23 mmol/L (ref 22–32)
Calcium: 9.3 mg/dL (ref 8.9–10.3)
Chloride: 106 mmol/L (ref 98–111)
Creatinine, Ser: 0.53 mg/dL (ref 0.44–1.00)
GFR, Estimated: >60 mL/min (ref >60)
Glucose, Bld: 85 mg/dL (ref 70–99)
Potassium: 4.2 mmol/L (ref 3.5–5.1)
Sodium: 137 mmol/L (ref 135–145)
Total Bilirubin: 0.5 mg/dL (ref 0.3–1.2)
Total Protein: 6.7 g/dL (ref 6.5–8.1)

## 2022-09-11 LAB — CBC
HCT: 33.1 % — ABNORMAL LOW (ref 36.0–46.0)
Hemoglobin: 10.3 g/dL — ABNORMAL LOW (ref 12.0–15.0)
MCH: 26.8 pg (ref 26.0–34.0)
MCHC: 31.1 g/dL (ref 30.0–36.0)
MCV: 86 fL (ref 80.0–100.0)
Platelets: 279 10*3/uL (ref 150–400)
RBC: 3.85 MIL/uL — ABNORMAL LOW (ref 3.87–5.11)
RDW: 21.3 % — ABNORMAL HIGH (ref 11.5–15.5)
WBC: 10.6 10*3/uL — ABNORMAL HIGH (ref 4.0–10.5)
nRBC: 0 % (ref 0.0–0.2)

## 2022-09-11 NOTE — OB Triage Note (Signed)
Pt is a 26yo [id number], 36w 3d. She arrived to the unit with complaints of elevated blood pressures monitored at home, 4/10 headache, and pt stated "tingly hands and puffy face". She denies vaginal bleeding, reports positive fetal movement, and reports irregular contractions since Saturday- she rates contractions 4/10. VS stable, monitors applied and assessing.   Initial FHT 150 at 1420.

## 2022-09-11 NOTE — Discharge Summary (Signed)
Patient ID: Doris Lowe MRN: [id number] DOB/AGE: 1996-11-01 26 y.o.  Admit date: 09/11/2022 Discharge date: 09/11/2022  Admission Diagnoses: 26yo G4P2 at [redacted]w[redacted]d presents after calling the office with an elevated BP reading at home.   Discharge Diagnoses: Gestational Hypertension  Factors complicating pregnancy: 1. Obesity BMI: 36.33 Baseline labs: 04/05/22 Early 1 hour GTT: 91 P/C ratio: 81 CMP: wnl A1c: 6.1  2. Prior CS (G2) 2014 followed by VBAC (G4) H/o prior Cesarean Section for cord prolapse during 37-38wk IOL for pre-e. Operative report requested.  Confirmed LTCS per notes found in care everywhere Pt desires TOLAC.  3. Anemia 07/13/22 Hgb 9.2 at 28wks 07/26/22 Ferritin 4 08/10/22 starting on IV iron d/t severe constipation Start PO supplement BID, recheck labs 4wks.   4. Prior Pre-eclampsia - G2 start baby ASA at  12wks - taking as of 21wks Baseline labs per BMI problem  5. Gestational Hypertension 10/6 131/85; 10/20 136/92 and 130/82   Prenatal Procedures: NST  Consults: None  Significant Diagnostic Studies:  Results for orders placed or performed during the hospital encounter of 09/11/22 (from the past 168 hour(s))  Protein / creatinine ratio, urine   Collection Time: 09/11/22  2:53 PM  Result Value Ref Range   Creatinine, Urine 62 mg/dL   Total Protein, Urine <6 mg/dL   Protein Creatinine Ratio        0.00 - 0.15 mg/mg[Cre]  Comprehensive metabolic panel   Collection Time: 09/11/22  3:04 PM  Result Value Ref Range   Sodium 137 135 - 145 mmol/L   Potassium 4.2 3.5 - 5.1 mmol/L   Chloride 106 98 - 111 mmol/L   CO2 23 22 - 32 mmol/L   Glucose, Bld 85 70 - 99 mg/dL   BUN 8 6 - 20 mg/dL   Creatinine, Ser 5.73 0.44 - 1.00 mg/dL   Calcium 9.3 8.9 - 22.0 mg/dL   Total Protein 6.7 6.5 - 8.1 g/dL   Albumin 3.0 (L) 3.5 - 5.0 g/dL   AST 22 15 - 41 U/L   ALT 20 0 - 44 U/L   Alkaline Phosphatase 149 (H) 38 - 126 U/L   Total Bilirubin 0.5 0.3 - 1.2 mg/dL   GFR,  Estimated >25 >42 mL/min   Anion gap 8 5 - 15  CBC   Collection Time: 09/11/22  3:04 PM  Result Value Ref Range   WBC 10.6 (H) 4.0 - 10.5 K/uL   RBC 3.85 (L) 3.87 - 5.11 MIL/uL   Hemoglobin 10.3 (L) 12.0 - 15.0 g/dL   HCT 70.6 (L) 23.7 - 62.8 %   MCV 86.0 80.0 - 100.0 fL   MCH 26.8 26.0 - 34.0 pg   MCHC 31.1 30.0 - 36.0 g/dL   RDW 31.5 (H) 17.6 - 16.0 %   Platelets 279 150 - 400 K/uL   nRBC 0.0 0.0 - 0.2 %    Treatments: none  Hospital Course:  This is a 26 y.o. V3X1062 with IUP at [redacted]w[redacted]d seen for elevated BPs.  Labs collected and BPs monitored (see below).  She was observed, no s/s of Pre-E, fetal heart rate monitoring remained reassuring, and she had no signs/symptoms of labor or other maternal-fetal concerns.  She has been scheduled for a follow up appt in the office in 2 days and set up for IOL on Saturday at 37 weeks. She was deemed stable for discharge to home with outpatient follow up.  Discharge Physical Exam:  BP 134/79   Pulse 91   Temp 98.4  F (36.9 C) (Oral)   Resp 16   Ht 5' (1.524 m)   Wt 97.5 kg   BMI 41.99 kg/m    Vitals:   09/11/22 1423 09/11/22 1438 09/11/22 1453 09/11/22 1509  BP: (!) 148/93 139/83 139/80 (!) 148/91   09/11/22 1524 09/11/22 1538 09/11/22 1539 09/11/22 1554  BP: (!) 147/73 (!) 145/80 (!) 145/80 (!) 141/80   09/11/22 1608  BP: 134/79    General: NAD CV: RRR Pulm: CTABL, nl effort ABD: s/nd/nt, gravid DVT Evaluation: LE non-ttp, no evidence of DVT on exam.  NST: FHR baseline: 145 bpm Variability: moderate Accelerations: yes Decelerations: none Category/reactivity: reactive  TOCO: quiet SVE: deferred      Discharge Condition: Stable  Disposition: Discharge disposition: 01-Home or Self Care        Allergies as of 09/11/2022       Reactions   Sulfa Antibiotics Hives        Medication List     TAKE these medications    acetaminophen 325 MG tablet Commonly known as: Tylenol Take 2 tablets (650 mg total) by  mouth every 4 (four) hours as needed (for pain scale < 4).   prenatal multivitamin Tabs tablet Take 1 tablet by mouth daily at 12 noon.        Follow-up [facility] Follow up.   Why: Keep all scheduled appointments Contact information: [address] [place] [phone]                Signed:  Regina Eck 09/11/2022 5:28 PM

## 2022-09-11 NOTE — OB Triage Note (Signed)
Discharge instructions provided to pt. Pt verbalizes understanding. Vaginal bleeding and discharge, contractions, and fetal movement reviewed by RN. Follow-up care reviewed. Pt instructed to continue monitoring blood pressures at home. She understands that she needs to be seen at her apt on 10/26 at [facility] and her induction will be at 10/28. Pt discharged home with significant other stable.

## 2022-09-13 ENCOUNTER — Other Ambulatory Visit: Payer: Self-pay

## 2022-09-13 ENCOUNTER — Encounter: Payer: Self-pay | Admitting: Obstetrics and Gynecology

## 2022-09-13 ENCOUNTER — Observation Stay
Admission: EM | Admit: 2022-09-13 | Discharge: 2022-09-13 | Disposition: A | Discharge status: Home / Self Care | Payer: Medicaid Other | Attending: Obstetrics and Gynecology | Admitting: Obstetrics and Gynecology

## 2022-09-13 DIAGNOSIS — Z3A36 36 weeks gestation of pregnancy: Secondary | ICD-10-CM | POA: Diagnosis not present

## 2022-09-13 DIAGNOSIS — O1493 Unspecified pre-eclampsia, third trimester: Secondary | ICD-10-CM | POA: Insufficient documentation

## 2022-09-13 DIAGNOSIS — E669 Obesity, unspecified: Secondary | ICD-10-CM | POA: Diagnosis not present

## 2022-09-13 DIAGNOSIS — O133 Gestational [pregnancy-induced] hypertension without significant proteinuria, third trimester: Principal | ICD-10-CM | POA: Insufficient documentation

## 2022-09-13 DIAGNOSIS — O99013 Anemia complicating pregnancy, third trimester: Secondary | ICD-10-CM | POA: Diagnosis not present

## 2022-09-13 DIAGNOSIS — Z6836 Body mass index (BMI) 36.0-36.9, adult: Secondary | ICD-10-CM | POA: Insufficient documentation

## 2022-09-13 DIAGNOSIS — O99213 Obesity complicating pregnancy, third trimester: Secondary | ICD-10-CM | POA: Diagnosis not present

## 2022-09-13 DIAGNOSIS — D649 Anemia, unspecified: Secondary | ICD-10-CM | POA: Insufficient documentation

## 2022-09-13 LAB — CBC
HCT: 35.8 % — ABNORMAL LOW (ref 36.0–46.0)
Hemoglobin: 11.3 g/dL — ABNORMAL LOW (ref 12.0–15.0)
MCH: 27.3 pg (ref 26.0–34.0)
MCHC: 31.6 g/dL (ref 30.0–36.0)
MCV: 86.5 fL (ref 80.0–100.0)
Platelets: 258 10*3/uL (ref 150–400)
RBC: 4.14 MIL/uL (ref 3.87–5.11)
RDW: 21.1 % — ABNORMAL HIGH (ref 11.5–15.5)
WBC: 11.5 10*3/uL — ABNORMAL HIGH (ref 4.0–10.5)
nRBC: 0 % (ref 0.0–0.2)

## 2022-09-13 LAB — COMPREHENSIVE METABOLIC PANEL
ALT: 23 U/L (ref 0–44)
AST: 23 U/L (ref 15–41)
Albumin: 3.3 g/dL — ABNORMAL LOW (ref 3.5–5.0)
Alkaline Phosphatase: 168 U/L — ABNORMAL HIGH (ref 38–126)
Anion gap: 9 (ref 5–15)
BUN: 6 mg/dL (ref 6–20)
CO2: 20 mmol/L — ABNORMAL LOW (ref 22–32)
Calcium: 9.2 mg/dL (ref 8.9–10.3)
Chloride: 106 mmol/L (ref 98–111)
Creatinine, Ser: 0.48 mg/dL (ref 0.44–1.00)
GFR, Estimated: >60 mL/min (ref >60)
Glucose, Bld: 78 mg/dL (ref 70–99)
Potassium: 3.9 mmol/L (ref 3.5–5.1)
Sodium: 135 mmol/L (ref 135–145)
Total Bilirubin: 0.5 mg/dL (ref 0.3–1.2)
Total Protein: 7.1 g/dL (ref 6.5–8.1)

## 2022-09-13 LAB — OB RESULTS CONSOLE HIV ANTIBODY (ROUTINE TESTING): HIV: NONREACTIVE

## 2022-09-13 LAB — PROTEIN / CREATININE RATIO, URINE
Creatinine, Urine: 33 mg/dL
Total Protein, Urine: <6 mg/dL

## 2022-09-13 LAB — RAPID HIV SCREEN (HIV 1/2 AB+AG)
HIV 1/2 Antibodies: NONREACTIVE
HIV-1 P24 Antigen - HIV24: NONREACTIVE

## 2022-09-13 LAB — OB RESULTS CONSOLE GC/CHLAMYDIA
Chlamydia: NEGATIVE
Neisseria Gonorrhea: NEGATIVE

## 2022-09-13 NOTE — OB Triage Note (Signed)
Pt G4P2 at [redacted]w[redacted]d presents from office for [facility] workup. Pt reports mild HA earlier today that resolved on its own. Denies epigastric pain or vision changes.

## 2022-09-13 NOTE — OB Triage Note (Signed)
Pt given discharge instructions including labor and preE precautions. Pt verbalized understanding.

## 2022-09-13 NOTE — Discharge Summary (Signed)
Patient ID: Doris Lowe MRN: [id number] DOB/AGE: 1996/01/17 26 y.o.  Admit date: 09/13/2022 Discharge date: 09/13/2022  Admission Diagnoses: 26yo G4P2 at [redacted]w[redacted]d presents after having mild range BP in the office today.   Discharge Diagnoses: Gestational Hypertension  Factors complicating pregnancy: 1. Obesity BMI: 36.33 Baseline labs: 04/05/22 Early 1 hour GTT: 91 P/C ratio: 81 CMP: wnl A1c: 6.1  2. Prior CS (G2) 2014 followed by VBAC (G4) H/o prior Cesarean Section for cord prolapse during 37-38wk IOL for pre-e. Operative report requested.  Confirmed LTCS per notes found in care everywhere Pt desires TOLAC.  3. Anemia 07/13/22 Hgb 9.2 at 28wks 07/26/22 Ferritin 4 08/10/22 starting on IV iron d/t severe constipation Start PO supplement BID, recheck labs 4wks.   4. Prior Pre-eclampsia - G2 start baby ASA at  12wks - taking as of 21wks Baseline labs per BMI problem  5. Gestational Hypertension 10/6 131/85; 10/20 136/92 and 130/82   Prenatal Procedures: NST  Consults: None  Significant Diagnostic Studies:  Results for orders placed or performed during the hospital encounter of 09/13/22 (from the past 168 hour(s))  Protein / creatinine ratio, urine   Collection Time: 09/13/22  3:41 PM  Result Value Ref Range   Creatinine, Urine 33 mg/dL   Total Protein, Urine <6 mg/dL   Protein Creatinine Ratio        0.00 - 0.15 mg/mg[Cre]  Rapid HIV screen (HIV 1/2 Ab+Ag)   Collection Time: 09/13/22  3:47 PM  Result Value Ref Range   HIV-1 P24 Antigen - HIV24 NON REACTIVE NON REACTIVE   HIV 1/2 Antibodies NON REACTIVE NON REACTIVE   Interpretation (HIV Ag Ab)      A non reactive test result means that HIV 1 or HIV 2 antibodies and HIV 1 p24 antigen were not detected in the specimen.  CBC   Collection Time: 09/13/22  3:51 PM  Result Value Ref Range   WBC 11.5 (H) 4.0 - 10.5 K/uL   RBC 4.14 3.87 - 5.11 MIL/uL   Hemoglobin 11.3 (L) 12.0 - 15.0 g/dL   HCT 71.0 (L) 62.6 - 94.8 %   MCV  86.5 80.0 - 100.0 fL   MCH 27.3 26.0 - 34.0 pg   MCHC 31.6 30.0 - 36.0 g/dL   RDW 54.6 (H) 27.0 - 35.0 %   Platelets 258 150 - 400 K/uL   nRBC 0.0 0.0 - 0.2 %  Comprehensive metabolic panel   Collection Time: 09/13/22  3:51 PM  Result Value Ref Range   Sodium 135 135 - 145 mmol/L   Potassium 3.9 3.5 - 5.1 mmol/L   Chloride 106 98 - 111 mmol/L   CO2 20 (L) 22 - 32 mmol/L   Glucose, Bld 78 70 - 99 mg/dL   BUN 6 6 - 20 mg/dL   Creatinine, Ser 0.93 0.44 - 1.00 mg/dL   Calcium 9.2 8.9 - 81.8 mg/dL   Total Protein 7.1 6.5 - 8.1 g/dL   Albumin 3.3 (L) 3.5 - 5.0 g/dL   AST 23 15 - 41 U/L   ALT 23 0 - 44 U/L   Alkaline Phosphatase 168 (H) 38 - 126 U/L   Total Bilirubin 0.5 0.3 - 1.2 mg/dL   GFR, Estimated >29 >93 mL/min   Anion gap 9 5 - 15  Results for orders placed or performed during the hospital encounter of 09/11/22 (from the past 168 hour(s))  Protein / creatinine ratio, urine   Collection Time: 09/11/22  2:53 PM  Result Value Ref  Range   Creatinine, Urine 62 mg/dL   Total Protein, Urine <6 mg/dL   Protein Creatinine Ratio        0.00 - 0.15 mg/mg[Cre]  Comprehensive metabolic panel   Collection Time: 09/11/22  3:04 PM  Result Value Ref Range   Sodium 137 135 - 145 mmol/L   Potassium 4.2 3.5 - 5.1 mmol/L   Chloride 106 98 - 111 mmol/L   CO2 23 22 - 32 mmol/L   Glucose, Bld 85 70 - 99 mg/dL   BUN 8 6 - 20 mg/dL   Creatinine, Ser 0.53 0.44 - 1.00 mg/dL   Calcium 9.3 8.9 - 10.3 mg/dL   Total Protein 6.7 6.5 - 8.1 g/dL   Albumin 3.0 (L) 3.5 - 5.0 g/dL   AST 22 15 - 41 U/L   ALT 20 0 - 44 U/L   Alkaline Phosphatase 149 (H) 38 - 126 U/L   Total Bilirubin 0.5 0.3 - 1.2 mg/dL   GFR, Estimated >60 >60 mL/min   Anion gap 8 5 - 15  CBC   Collection Time: 09/11/22  3:04 PM  Result Value Ref Range   WBC 10.6 (H) 4.0 - 10.5 K/uL   RBC 3.85 (L) 3.87 - 5.11 MIL/uL   Hemoglobin 10.3 (L) 12.0 - 15.0 g/dL   HCT 33.1 (L) 36.0 - 46.0 %   MCV 86.0 80.0 - 100.0 fL   MCH 26.8 26.0 -  34.0 pg   MCHC 31.1 30.0 - 36.0 g/dL   RDW 21.3 (H) 11.5 - 15.5 %   Platelets 279 150 - 400 K/uL   nRBC 0.0 0.0 - 0.2 %    Treatments: none  Hospital Course:  This is a 26 y.o. [id number] with IUP at [redacted]w[redacted]d seen for elevated BPs.  Labs collected and BPs monitored (see below).  She was observed, no s/s of Pre-E, fetal heart rate monitoring remained reassuring, and she had no signs/symptoms of labor or other maternal-fetal concerns.  She has been scheduled for IOL on Saturday at 37 weeks. She was deemed stable for discharge to home with outpatient follow up.  Discharge Physical Exam:  BP 133/72   Pulse 89   Temp 98.8 F (37.1 C) (Oral)   Resp 17   Ht 5' (1.524 m)   Wt 113.4 kg   BMI 48.82 kg/m    Vitals:   09/13/22 1537 09/13/22 1553 09/13/22 1608 09/13/22 1623  BP: (!) 137/93 132/73 135/76 137/73   09/13/22 1624 09/13/22 1638 09/13/22 1653 09/13/22 1708  BP: 137/73 (!) 142/87 125/72 133/72    General: NAD CV: RRR Pulm: CTABL, nl effort ABD: s/nd/nt, gravid DVT Evaluation: LE non-ttp, no evidence of DVT on exam.  NST: FHR baseline: 140 bpm Variability: moderate Accelerations: yes Decelerations: none Category/reactivity: reactive  TOCO: quiet SVE: deferred      Discharge Condition: Stable  Disposition: Discharge disposition: 01-Home or Self Care          Signed: Francetta Found, CNM 09/13/2022 7:02 PM

## 2022-09-14 ENCOUNTER — Ambulatory Visit
Admission: RE | Admit: 2022-09-14 | Discharge: 2022-09-14 | Disposition: A | Discharge status: Home / Self Care | Payer: Medicaid Other | Source: Ambulatory Visit | Attending: Certified Nurse Midwife | Admitting: Certified Nurse Midwife

## 2022-09-14 ENCOUNTER — Ambulatory Visit: Payer: Medicaid Other

## 2022-09-14 DIAGNOSIS — O99019 Anemia complicating pregnancy, unspecified trimester: Secondary | ICD-10-CM | POA: Insufficient documentation

## 2022-09-14 LAB — RPR: RPR Ser Ql: NONREACTIVE

## 2022-09-14 MED ORDER — SODIUM CHLORIDE (PF) 0.9 % IJ SOLN
INTRAMUSCULAR | Status: AC
  Filled 2022-09-14: qty 10

## 2022-09-14 MED ORDER — SODIUM CHLORIDE 0.9 % IV SOLN
300.0000 mg | Freq: Once | INTRAVENOUS | Status: AC
  Administered 2022-09-14: 300 mg via INTRAVENOUS
  Filled 2022-09-14: qty 300

## 2022-09-15 ENCOUNTER — Inpatient Hospital Stay: Payer: Medicaid Other | Admitting: Anesthesiology

## 2022-09-15 ENCOUNTER — Other Ambulatory Visit: Payer: Self-pay

## 2022-09-15 ENCOUNTER — Inpatient Hospital Stay
Admission: EM | Admit: 2022-09-15 | Discharge: 2022-09-18 | DRG: 807 | Disposition: A | Discharge status: Home / Self Care | Payer: Medicaid Other

## 2022-09-15 ENCOUNTER — Encounter: Payer: Self-pay | Admitting: Obstetrics and Gynecology

## 2022-09-15 DIAGNOSIS — O99214 Obesity complicating childbirth: Secondary | ICD-10-CM | POA: Diagnosis present

## 2022-09-15 DIAGNOSIS — O34219 Maternal care for unspecified type scar from previous cesarean delivery: Secondary | ICD-10-CM | POA: Diagnosis present

## 2022-09-15 DIAGNOSIS — O9081 Anemia of the puerperium: Secondary | ICD-10-CM | POA: Diagnosis not present

## 2022-09-15 DIAGNOSIS — O134 Gestational [pregnancy-induced] hypertension without significant proteinuria, complicating childbirth: Principal | ICD-10-CM | POA: Diagnosis present

## 2022-09-15 DIAGNOSIS — Z3A37 37 weeks gestation of pregnancy: Secondary | ICD-10-CM | POA: Diagnosis not present

## 2022-09-15 DIAGNOSIS — D62 Acute posthemorrhagic anemia: Secondary | ICD-10-CM | POA: Diagnosis not present

## 2022-09-15 DIAGNOSIS — O139 Gestational [pregnancy-induced] hypertension without significant proteinuria, unspecified trimester: Principal | ICD-10-CM | POA: Diagnosis present

## 2022-09-15 LAB — CBC
HCT: 33.9 % — ABNORMAL LOW (ref 36.0–46.0)
Hemoglobin: 10.7 g/dL — ABNORMAL LOW (ref 12.0–15.0)
MCH: 27.1 pg (ref 26.0–34.0)
MCHC: 31.6 g/dL (ref 30.0–36.0)
MCV: 85.8 fL (ref 80.0–100.0)
Platelets: 250 10*3/uL (ref 150–400)
RBC: 3.95 MIL/uL (ref 3.87–5.11)
RDW: 21.1 % — ABNORMAL HIGH (ref 11.5–15.5)
WBC: 10.5 10*3/uL (ref 4.0–10.5)
nRBC: 0.2 % (ref 0.0–0.2)

## 2022-09-15 LAB — COMPREHENSIVE METABOLIC PANEL
ALT: 20 U/L (ref 0–44)
AST: 23 U/L (ref 15–41)
Albumin: 3 g/dL — ABNORMAL LOW (ref 3.5–5.0)
Alkaline Phosphatase: 160 U/L — ABNORMAL HIGH (ref 38–126)
Anion gap: 7 (ref 5–15)
BUN: 6 mg/dL (ref 6–20)
CO2: 21 mmol/L — ABNORMAL LOW (ref 22–32)
Calcium: 8.8 mg/dL — ABNORMAL LOW (ref 8.9–10.3)
Chloride: 108 mmol/L (ref 98–111)
Creatinine, Ser: 0.43 mg/dL — ABNORMAL LOW (ref 0.44–1.00)
GFR, Estimated: >60 mL/min (ref >60)
Glucose, Bld: 101 mg/dL — ABNORMAL HIGH (ref 70–99)
Potassium: 3.9 mmol/L (ref 3.5–5.1)
Sodium: 136 mmol/L (ref 135–145)
Total Bilirubin: 0.6 mg/dL (ref 0.3–1.2)
Total Protein: 6.6 g/dL (ref 6.5–8.1)

## 2022-09-15 LAB — GROUP B STREP BY PCR: Group B strep by PCR: NEGATIVE

## 2022-09-15 LAB — PROTEIN / CREATININE RATIO, URINE
Creatinine, Urine: 64 mg/dL
Total Protein, Urine: <6 mg/dL

## 2022-09-15 LAB — TYPE AND SCREEN
ABO/RH(D): B POS
Antibody Screen: NEGATIVE

## 2022-09-15 MED ORDER — FENTANYL-BUPIVACAINE-NACL 0.5-0.125-0.9 MG/250ML-% EP SOLN
12.0000 mL/h | EPIDURAL | Status: DC | PRN
  Administered 2022-09-15: 12 mL/h via EPIDURAL
  Filled 2022-09-15: qty 250

## 2022-09-15 MED ORDER — OXYTOCIN BOLUS FROM INFUSION: 333.0000 mL | Freq: Once | INTRAVENOUS | Status: DC

## 2022-09-15 MED ORDER — FENTANYL CITRATE (PF) 100 MCG/2ML IJ SOLN
50.0000 ug | INTRAMUSCULAR | Status: DC | PRN
  Administered 2022-09-15: 100 ug via INTRAVENOUS
  Filled 2022-09-15: qty 2

## 2022-09-15 MED ORDER — EPHEDRINE 5 MG/ML INJ: 10.0000 mg | INTRAVENOUS | Status: DC | PRN

## 2022-09-15 MED ORDER — PHENYLEPHRINE 80 MCG/ML (10ML) SYRINGE FOR IV PUSH (FOR BLOOD PRESSURE SUPPORT): 80.0000 ug | PREFILLED_SYRINGE | INTRAVENOUS | Status: DC | PRN

## 2022-09-15 MED ORDER — OXYTOCIN-SODIUM CHLORIDE 30-0.9 UT/500ML-% IV SOLN
1.0000 m[IU]/min | INTRAVENOUS | Status: DC
  Filled 2022-09-15: qty 500

## 2022-09-15 MED ORDER — LIDOCAINE HCL (PF) 1 % IJ SOLN
INTRAMUSCULAR | Status: AC
  Filled 2022-09-15: qty 30

## 2022-09-15 MED ORDER — TERBUTALINE SULFATE 1 MG/ML IJ SOLN: 0.2500 mg | Freq: Once | INTRAMUSCULAR | Status: DC | PRN

## 2022-09-15 MED ORDER — OXYCODONE-ACETAMINOPHEN 5-325 MG PO TABS: 1.0000 | ORAL_TABLET | ORAL | Status: DC | PRN

## 2022-09-15 MED ORDER — SOD CITRATE-CITRIC ACID 500-334 MG/5ML PO SOLN: 30.0000 mL | ORAL | Status: DC | PRN

## 2022-09-15 MED ORDER — LIDOCAINE HCL (PF) 1 % IJ SOLN: 30.0000 mL | INTRAMUSCULAR | Status: DC | PRN

## 2022-09-15 MED ORDER — MISOPROSTOL 200 MCG PO TABS
ORAL_TABLET | ORAL | Status: AC
  Filled 2022-09-15: qty 4

## 2022-09-15 MED ORDER — HYDRALAZINE HCL 20 MG/ML IJ SOLN: 10.0000 mg | INTRAMUSCULAR | Status: DC | PRN

## 2022-09-15 MED ORDER — SODIUM CHLORIDE (PF) 0.9 % IJ SOLN
INTRAMUSCULAR | Status: AC
  Filled 2022-09-15: qty 50

## 2022-09-15 MED ORDER — LACTATED RINGERS IV SOLN: 500.0000 mL | Freq: Once | INTRAVENOUS | Status: DC

## 2022-09-15 MED ORDER — LACTATED RINGERS IV SOLN: INTRAVENOUS | Status: DC

## 2022-09-15 MED ORDER — AMMONIA AROMATIC IN INHA
RESPIRATORY_TRACT | Status: AC
  Filled 2022-09-15: qty 10

## 2022-09-15 MED ORDER — OXYTOCIN 10 UNIT/ML IJ SOLN
INTRAMUSCULAR | Status: AC
  Filled 2022-09-15: qty 2

## 2022-09-15 MED ORDER — LABETALOL HCL 5 MG/ML IV SOLN: 20.0000 mg | INTRAVENOUS | Status: DC | PRN

## 2022-09-15 MED ORDER — LABETALOL HCL 5 MG/ML IV SOLN: 40.0000 mg | INTRAVENOUS | Status: DC | PRN

## 2022-09-15 MED ORDER — DIPHENHYDRAMINE HCL 50 MG/ML IJ SOLN: 12.5000 mg | INTRAMUSCULAR | Status: DC | PRN

## 2022-09-15 MED ORDER — OXYTOCIN-SODIUM CHLORIDE 30-0.9 UT/500ML-% IV SOLN
INTRAVENOUS | Status: AC
  Administered 2022-09-15: 2 m[IU]/min via INTRAVENOUS
  Filled 2022-09-15: qty 500

## 2022-09-15 MED ORDER — LABETALOL HCL 5 MG/ML IV SOLN: 80.0000 mg | INTRAVENOUS | Status: DC | PRN

## 2022-09-15 MED ORDER — ACETAMINOPHEN 325 MG PO TABS
650.0000 mg | ORAL_TABLET | ORAL | Status: DC | PRN
  Administered 2022-09-16: 650 mg via ORAL
  Filled 2022-09-15: qty 2

## 2022-09-15 MED ORDER — OXYTOCIN-SODIUM CHLORIDE 30-0.9 UT/500ML-% IV SOLN
2.5000 [IU]/h | INTRAVENOUS | Status: DC
  Filled 2022-09-15: qty 500

## 2022-09-15 MED ORDER — ONDANSETRON HCL 4 MG/2ML IJ SOLN
4.0000 mg | Freq: Four times a day (QID) | INTRAMUSCULAR | Status: DC | PRN
  Administered 2022-09-16: 4 mg via INTRAVENOUS
  Filled 2022-09-15: qty 2

## 2022-09-15 MED ORDER — FENTANYL-BUPIVACAINE-NACL 0.5-0.125-0.9 MG/250ML-% EP SOLN
EPIDURAL | Status: AC
  Administered 2022-09-16: 12 mL/h via EPIDURAL
  Filled 2022-09-15: qty 250

## 2022-09-15 MED ORDER — BUTORPHANOL TARTRATE 2 MG/ML IJ SOLN
INTRAMUSCULAR | Status: AC
  Filled 2022-09-15: qty 1

## 2022-09-15 MED ORDER — LACTATED RINGERS IV SOLN: 500.0000 mL | INTRAVENOUS | Status: DC | PRN

## 2022-09-15 MED ORDER — BUTORPHANOL TARTRATE 1 MG/ML IJ SOLN
1.0000 mg | INTRAMUSCULAR | Status: DC | PRN
  Administered 2022-09-15: 1 mg via INTRAVENOUS

## 2022-09-15 MED ORDER — OXYCODONE-ACETAMINOPHEN 5-325 MG PO TABS: 2.0000 | ORAL_TABLET | ORAL | Status: DC | PRN

## 2022-09-15 NOTE — Progress Notes (Signed)
Labor Progress Note  Doris Lowe is a 26 y.o. [id number] at [redacted]w[redacted]d by LMP admitted for induction of labor due to Hypertension.  Subjective: Rates UCs 6/10  Objective: BP 114/67   Pulse 96   Temp 98.2 F (36.8 C) (Oral)   Resp 16   Ht 5' (1.524 m)   Wt 97.5 kg Comment: 215lbs  BMI 41.99 kg/m  Vitals:   09/15/22 0853 09/15/22 0933 09/15/22 1107 09/15/22 1227  BP: (!) 148/91 (!) 143/71 133/73 133/73   09/15/22 1323 09/15/22 1444 09/15/22 1545  BP: 124/60 139/74 114/67     Fetal Assessment: FHT:  FHR: 150 bpm, variability: moderate,  accelerations:  Present,  decelerations:  Absent Category/reactivity:  Category I UC:   regular, every 2-4 minutes SVE:    Dilation: 1cm  Effacement: Long  Station:  -3  Consistency: soft  Position: anterior  Membrane status: Intact Amniotic color: n/a  Labs: Lab Results  Component Value Date   WBC 10.5 09/15/2022   HGB 10.7 (L) 09/15/2022   HCT 33.9 (L) 09/15/2022   MCV 85.8 09/15/2022   PLT 250 09/15/2022    Assessment / Plan: Induction of labor due to gestational hypertension,  progressing well on pitocin 1045 Pitocin started 1750 Catheter balloon placed with 28mL NS Currently at 16mU  Labor: Progressing on Pitocin, will continue to increase then AROM Preeclampsia:  no signs or symptoms of toxicity, labs stable, and BPs noted above Fetal Wellbeing:  Category I Pain Control:  Labor support without medications I/D:   Afebrile, GBS neg, Intact Anticipated MOD:  NSVD  Clydene Laming, CNM 09/15/2022, 5:50 PM

## 2022-09-15 NOTE — Progress Notes (Signed)
Labor Progress Note  Doris Lowe is a 26 y.o. [id number] at [redacted]w[redacted]d by LMP admitted for induction of labor due to Hypertension.  Subjective: Rates UCs 4-5/10  Objective: BP 124/60   Pulse 86   Temp 98.2 F (36.8 C) (Oral)   Resp 16   Ht 5' (1.524 m)   Wt 97.5 kg Comment: 215lbs  BMI 41.99 kg/m  Vitals:   09/15/22 0853 09/15/22 0933 09/15/22 1107 09/15/22 1227  BP: (!) 148/91 (!) 143/71 133/73 133/73   09/15/22 1323  BP: 124/60     Fetal Assessment: FHT:  FHR: 140 bpm, variability: moderate,  accelerations:  Present,  decelerations:  Absent Category/reactivity:  Category I UC:   regular, every 2-4 minutes SVE:    Dilation: Closed  Effacement: Long  Station:  Floating  Consistency: soft  Position: posterior  Membrane status: Intact Amniotic color: n/a  Labs: Lab Results  Component Value Date   WBC 10.5 09/15/2022   HGB 10.7 (L) 09/15/2022   HCT 33.9 (L) 09/15/2022   MCV 85.8 09/15/2022   PLT 250 09/15/2022    Assessment / Plan: Induction of labor due to gestational hypertension,  progressing well on pitocin 1045 Pitocin started Currently at 43mU  Labor: Progressing on Pitocin, will continue to increase then AROM Preeclampsia:  no signs or symptoms of toxicity, labs stable, and BPs noted above Fetal Wellbeing:  Category I Pain Control:  Labor support without medications I/D:   Afebrile, GBS neg, Intact Anticipated MOD:  NSVD  Clydene Laming, CNM 09/15/2022, 2:38 PM

## 2022-09-15 NOTE — Progress Notes (Signed)
Labor Progress Note  Doris Lowe is a 26 y.o. [id number] at [redacted]w[redacted]d by LMP admitted for induction of labor due to Hypertension.  Subjective: Pt is having a hard time coping at this time and requesting a second dose of IV pain medication  Objective: BP 114/67   Pulse 96   Temp 98.2 F (36.8 C) (Oral)   Resp 16   Ht 5' (1.524 m)   Wt 97.5 kg Comment: 215lbs  BMI 41.99 kg/m  Vitals:   09/15/22 0853 09/15/22 0933 09/15/22 1107 09/15/22 1227  BP: (!) 148/91 (!) 143/71 133/73 133/73   09/15/22 1323 09/15/22 1444 09/15/22 1545  BP: 124/60 139/74 114/67     Fetal Assessment: FHT:  FHR: 145 bpm, variability: moderate,  accelerations:  Present,  decelerations:  Absent Category/reactivity:  Category I UC:   regular, every 3 minutes SVE:    Dilation: 3cm  Effacement: 50%  Station:  -2  Consistency: soft  Position: anterior  Membrane status: Intact Amniotic color: n/a  Labs: Lab Results  Component Value Date   WBC 10.5 09/15/2022   HGB 10.7 (L) 09/15/2022   HCT 33.9 (L) 09/15/2022   MCV 85.8 09/15/2022   PLT 250 09/15/2022    Assessment / Plan: Induction of labor due to gestational hypertension,  progressing well on pitocin 1045 Pitocin started 1750 Catheter balloon placed with 6mL NS 2030 Catheter balloon fell out Currently at 65mU  Labor: Progressing on Pitocin, will continue to increase then AROM Preeclampsia:  no signs or symptoms of toxicity, labs stable, and BPs noted above Fetal Wellbeing:  Category I Pain Control:  IV pain meds I/D:   Afebrile, GBS neg, Intact Anticipated MOD:  NSVD  Clydene Laming, CNM 09/15/2022, 8:41 PM

## 2022-09-15 NOTE — H&P (Signed)
OB History & Physical   History of Present Illness:  Chief Complaint:   HPI:  Doris Lowe is a 26 y.o. [phone] female at [redacted]w[redacted]d dated by LMP of 12/30/21.  She presents to L&D for IOL for GHTN  She reports:  -active fetal movement -no leakage of fluid -no vaginal bleeding -no contractions  Pregnancy Issues: 1. Obesity BMI: 36.33 2. Prior CS (G2) 2014 followed by VBAC (G4) 3. Anemia 4. Prior Pre-eclampsia - G2 5. Gestational Hypertension   Maternal Medical History:   Past Medical History:  Diagnosis Date   Bacteriuria    Chlamydia    Hypertension    Iron deficiency anemia    Pregnancy induced hypertension     Past Surgical History:  Procedure Laterality Date   CESAREAN SECTION N/A 11/08/2013   Procedure: CESAREAN SECTION;  Surgeon: Adam Phenix, MD;  Location: [facility];  Service: Obstetrics;  Laterality: N/A;   CESAREAN SECTION      Allergies  Allergen Reactions   Sulfa Antibiotics Hives    Prior to Admission medications   Medication Sig Start Date End Date Taking? Authorizing Provider  acetaminophen (TYLENOL) 325 MG tablet Take 2 tablets (650 mg total) by mouth every 4 (four) hours as needed (for pain scale < 4). Patient not taking: Reported on 09/11/2022 08/30/21   McVey, Prudencio Pair, CNM  Prenatal Vit-Fe Fumarate-FA (PRENATAL MULTIVITAMIN) TABS tablet Take 1 tablet by mouth daily at 12 noon.    [provider]     Prenatal care site: [facility]   Social History: She  reports that she has never smoked. She has never used smokeless tobacco. She reports that she does not currently use alcohol. She reports that she does not use drugs.  Family History: family history includes Cancer in her maternal grandfather and maternal grandmother; Dementia in her paternal grandmother; Hypertension in her paternal grandfather; Ovarian cancer in her maternal grandmother; Stroke in her father.   Review of Systems: A full review of systems was performed and  negative except as noted in the HPI.    Physical Exam:  Vital Signs: There were no vitals taken for this visit.  General:   alert and cooperative  Skin:  normal  Neurologic:    Alert & oriented x 3  Lungs:    Nl effort  Heart:   regular rate and rhythm  Abdomen:  soft, non-tender; bowel sounds normal; no masses,  no organomegaly  Extremities: : non-tender, symmetric, no edema bilaterally.  DTRs: normal     Pertinent Results:  Prenatal Labs: Blood type/Rh B pos  Antibody screen neg  Rubella Immune  Varicella Immune  RPR NR  HBsAg Neg  HIV NR  GC neg  Chlamydia neg  Genetic screening negative  1 hour GTT 115  3 hour GTT   GBS collected   FHT: FHR: 140 bpm, variability: moderate,  accelerations:  Present,  decelerations:  Absent Category/reactivity:  Category I TOCO: none SVE: Dilation: Fingertip / Effacement (%): 10 / Station: Ballotable    Cephalic by BSUS     Assessment:  Doris Lowe is a 26 y.o. [phone] female at [redacted]w[redacted]d with gestational hypertention.   Plan:  1. Admit to Labor & Delivery; consents reviewed and obtained Dr. Dalbert Garnet updated  Gestational Hypertension Labs on admission: PCR - <6    CMP WNL     CBC WNL IV Labetalol and Hydralazine ordered for BPs in the severe range Vitals:   09/15/22 0630 09/15/22 0933 09/15/22 1107  BP: (!) 148/91 (!) 143/71 133/73    VBAC - Prior CS (G2) 2014 followed by VBAC (G4)  2. Fetal Well being  - Fetal Tracing: Cat I - GBS pending - Presentation: VTX confirmed by BSUS   3. Routine OB: - Prenatal labs reviewed, as above - Rh pos - CBC & T&S on admit - Clear fluids, IVF  4. Induction of Labor -  Contractions by external toco in place -  Pelvis proven to 2450g -  Plan for induction with Pitocin -  Plan for continuous fetal monitoring  -  Maternal pain control as desired: IVPM, nitrous, regional anesthesia - Anticipate vaginal delivery  5. Post Partum Planning: - Infant feeding: Breastfeeding -  Contraception: TBD - Tdap: given 07/13/22  Linda Hedges, CNM 09/15/2022 8:38 AM

## 2022-09-15 NOTE — Progress Notes (Signed)
Labor Progress Note  Doris Lowe is a 26 y.o. [id number] at [redacted]w[redacted]d by LMP admitted for induction of labor due to Hypertension.  Subjective: Pt is having a hard time coping and is now requesting an epidural  Objective: BP (!) 141/70   Pulse 89   Temp 98.2 F (36.8 C) (Oral)   Resp 16   Ht 5' (1.524 m)   Wt 97.5 kg Comment: 215lbs  BMI 41.99 kg/m  Vitals:   09/15/22 0853 09/15/22 0933 09/15/22 1107 09/15/22 1227  BP: (!) 148/91 (!) 143/71 133/73 133/73   09/15/22 1323 09/15/22 1444 09/15/22 1545 09/15/22 1637  BP: 124/60 139/74 114/67 130/86   09/15/22 2123 09/15/22 2126  BP: (!) 154/89 (!) 141/70     Fetal Assessment: FHT:  FHR: 135 bpm, variability: moderate,  accelerations:  Present,  decelerations:  Present occasional earlies Category/reactivity:  Category I UC:   regular, every 3 minutes SVE:    Dilation: 3cm  Effacement: 50%  Station:  -2  Consistency: soft  Position: anterior  Membrane status: Intact Amniotic color: n/a  Labs: Lab Results  Component Value Date   WBC 10.5 09/15/2022   HGB 10.7 (L) 09/15/2022   HCT 33.9 (L) 09/15/2022   MCV 85.8 09/15/2022   PLT 250 09/15/2022    Assessment / Plan: Induction of labor due to gestational hypertension,  progressing well on pitocin 1045 Pitocin started 1750 Catheter balloon placed with 3mL NS 2030 Catheter balloon fell out Currently at 3mU  Labor: Progressing on Pitocin, will continue to increase then AROM Preeclampsia:  no signs or symptoms of toxicity, labs stable, and BPs noted above Fetal Wellbeing:  Category I Pain Control:  Epidural I/D:   Afebrile, GBS neg, Intact Anticipated MOD:  NSVD  Clydene Laming, CNM 09/15/2022, 11:36 PM

## 2022-09-16 LAB — RPR: RPR Ser Ql: NONREACTIVE

## 2022-09-16 MED ORDER — GENTAMICIN SULFATE 40 MG/ML IJ SOLN
5.0000 mg/kg | INTRAVENOUS | Status: DC
  Administered 2022-09-16: 490 mg via INTRAVENOUS
  Filled 2022-09-16: qty 12.25

## 2022-09-16 MED ORDER — LIDOCAINE HCL (PF) 1 % IJ SOLN
INTRAMUSCULAR | Status: DC | PRN
  Administered 2022-09-15: 1 mL via SUBCUTANEOUS

## 2022-09-16 MED ORDER — SODIUM CHLORIDE 0.9 % IV SOLN
INTRAVENOUS | Status: DC | PRN
  Administered 2022-09-15 (×2): 5 mL via EPIDURAL

## 2022-09-16 MED ORDER — CALCIUM CARBONATE ANTACID 500 MG PO CHEW
CHEWABLE_TABLET | ORAL | Status: AC
  Filled 2022-09-16: qty 4

## 2022-09-16 MED ORDER — LIDOCAINE-EPINEPHRINE (PF) 1.5 %-1:200000 IJ SOLN
INTRAMUSCULAR | Status: DC | PRN
  Administered 2022-09-15: 3 mL via EPIDURAL

## 2022-09-16 MED ORDER — SODIUM CHLORIDE 0.9 % IV SOLN
INTRAVENOUS | Status: AC
  Administered 2022-09-16: 2 g via INTRAVENOUS
  Filled 2022-09-16: qty 2000

## 2022-09-16 MED ORDER — SODIUM CHLORIDE 0.9 % IV SOLN
2.0000 g | Freq: Four times a day (QID) | INTRAVENOUS | Status: DC
  Filled 2022-09-16: qty 2000

## 2022-09-16 MED ORDER — CALCIUM CARBONATE ANTACID 500 MG PO CHEW
800.0000 mg | CHEWABLE_TABLET | Freq: Once | ORAL | Status: AC
  Administered 2022-09-16: 800 mg via ORAL

## 2022-09-16 NOTE — Progress Notes (Signed)
Labor Progress Note  Doris Lowe is a 26 y.o. [id number] at [redacted]w[redacted]d by LMP admitted for induction of labor due to Hypertension.  Subjective: Pt is comfortable with an epidural, Pt agreeable to position changes to open pelvic   Objective: BP 129/80   Pulse 80   Temp 98 F (36.7 C) (Oral)   Resp 14   Ht 5' (1.524 m)   Wt 97.5 kg Comment: 215lbs  SpO2 96%   BMI 41.99 kg/m  Vitals:   09/16/22 0055 09/16/22 0210 09/16/22 0310 09/16/22 0410  BP: 103/78 110/63 125/70 121/79   09/16/22 0510 09/16/22 0610 09/16/22 0710 09/16/22 0902  BP: 121/64 126/68 128/69 (!) 136/90   09/16/22 0910 09/16/22 1010 09/16/22 1227 09/16/22 1508  BP: 132/85 123/69 (!) 147/88 129/80     Fetal Assessment: FHT:  FHR: 135 bpm, variability: moderate,  accelerations:  Present,  decelerations:  Absent Category/reactivity:  Category I UC:   regular, every 1-3 minutes SVE:    Dilation: 4cm  Effacement: 60%  Station:  -3  Consistency: soft  Position: anterior  Membrane status: Intact Amniotic color: n/a  Labs: Lab Results  Component Value Date   WBC 10.5 09/15/2022   HGB 10.7 (L) 09/15/2022   HCT 33.9 (L) 09/15/2022   MCV 85.8 09/15/2022   PLT 250 09/15/2022    Assessment / Plan: Induction of labor due to gestational hypertension,  progressing well on pitocin 1045 Pitocin started 1750 Catheter balloon placed with 75mL NS 2030 Catheter balloon fell out 0055 Pitocin turned off 0200 Pitocin restarted  Currently at 44mU    Labor: Progressing on Pitocin, will continue to increase then AROM Preeclampsia:  no signs or symptoms of toxicity, labs stable, and BPs noted above Fetal Wellbeing:  Category I Pain Control:  Epidural I/D:   Afebrile, GBS neg, Intact Anticipated MOD:  NSVD  Clydene Laming, CNM 09/16/2022, 5:59 PM

## 2022-09-16 NOTE — Anesthesia Preprocedure Evaluation (Signed)
Anesthesia Evaluation  Patient identified by MRN, date of birth, ID band Patient awake    Reviewed: Allergy & Precautions, NPO status , Patient's Chart, lab work & pertinent test results  History of Anesthesia Complications Negative for: history of anesthetic complications  Airway Mallampati: III  TM Distance: >3 FB Neck ROM: full    Dental  (+) Chipped   Pulmonary neg pulmonary ROS,    Pulmonary exam normal        Cardiovascular Exercise Tolerance: Good hypertension, Normal cardiovascular exam     Neuro/Psych    GI/Hepatic negative GI ROS, neg GERD  ,  Endo/Other    Renal/GU   negative genitourinary   Musculoskeletal   Abdominal   Peds  Hematology negative hematology ROS (+)   Anesthesia Other Findings TOLAC  Past Medical History: No date: Bacteriuria No date: Chlamydia No date: Hypertension No date: Iron deficiency anemia No date: Pregnancy induced hypertension  Past Surgical History: 11/08/2013: CESAREAN SECTION; N/A     Comment:  Procedure: CESAREAN SECTION;  Surgeon: Woodroe Mode,               MD;  Location: [place];  Service: Obstetrics;  Laterality:              N/A; No date: CESAREAN SECTION 10/01/2021: CHOLECYSTECTOMY, LAPAROSCOPIC  BMI    Body Mass Index: 41.99 kg/m      Reproductive/Obstetrics (+) Pregnancy                             Anesthesia Physical Anesthesia Plan  ASA: 3  Anesthesia Plan: Epidural   Post-op Pain Management:    Induction:   PONV Risk Score and Plan:   Airway Management Planned: Natural Airway  Additional Equipment:   Intra-op Plan:   Post-operative Plan:   Informed Consent: I have reviewed the patients History and Physical, chart, labs and discussed the procedure including the risks, benefits and alternatives for the proposed anesthesia with the patient or authorized representative who has indicated his/her understanding  and acceptance.     Dental Advisory Given  Plan Discussed with: Anesthesiologist  Anesthesia Plan Comments: (Patient reports no bleeding problems and no anticoagulant use.   Patient consented for risks of anesthesia including but not limited to:  - adverse reactions to medications - risk of bleeding, infection and or nerve damage from epidural that could lead to paralysis - risk of headache or failed epidural - nerve damage due to positioning - that if epidural is used for C-section that there is a chance of epidural failure requiring spinal placement or conversion to GA - Damage to heart, brain, lungs, other parts of body or loss of life  Patient voiced understanding.)        Anesthesia Quick Evaluation

## 2022-09-16 NOTE — Progress Notes (Signed)
Labor Progress Note  Doris Lowe is a 26 y.o. [id number] at [redacted]w[redacted]d by LMP admitted for induction of labor due to Hypertension.  Subjective: Pt is now comfortable with an epidural  Objective: BP (!) 140/71   Pulse (!) 114   Temp 98.2 F (36.8 C) (Oral)   Resp 16   Ht 5' (1.524 m)   Wt 97.5 kg Comment: 215lbs  SpO2 100%   BMI 41.99 kg/m  Vitals:   09/15/22 0933 09/15/22 1107 09/15/22 1227 09/15/22 1323  BP: (!) 143/71 133/73 133/73 124/60   09/15/22 1444 09/15/22 1545 09/15/22 1637 09/15/22 2123  BP: 139/74 114/67 130/86 (!) 154/89   09/15/22 2126 09/15/22 2350 09/16/22 0004 09/16/22 0010  BP: (!) 141/70 (!) 141/81 (!) 146/71 (!) 140/71     Fetal Assessment: FHT:  FHR: 135 bpm, variability: moderate,  accelerations:  Present,  decelerations:  Absent Category/reactivity:  Category I UC:   regular, every 3 minutes SVE:    Dilation: 3cm  Effacement: 50%  Station:  -2  Consistency: soft  Position: anterior  Membrane status: Intact Amniotic color: n/a  Labs: Lab Results  Component Value Date   WBC 10.5 09/15/2022   HGB 10.7 (L) 09/15/2022   HCT 33.9 (L) 09/15/2022   MCV 85.8 09/15/2022   PLT 250 09/15/2022    Assessment / Plan: Induction of labor due to gestational hypertension,  progressing well on pitocin 1045 Pitocin started 1750 Catheter balloon placed with 59mL NS 2030 Catheter balloon fell out Attempted amniotomy without success, pt tolerated well, will try again later  Currently at 15mU - will turn off pitocin for 36min - an hour and then restart Will give Tums  Labor: Progressing on Pitocin, will continue to increase then AROM Preeclampsia:  no signs or symptoms of toxicity, labs stable, and BPs noted above Fetal Wellbeing:  Category I Pain Control:  Epidural I/D:   Afebrile, GBS neg, Intact Anticipated MOD:  NSVD  Clydene Laming, CNM 09/16/2022, 12:57 AM

## 2022-09-16 NOTE — Progress Notes (Signed)
Labor Progress Note  Doris Lowe is a 26 y.o. [id number] at [redacted]w[redacted]d by LMP admitted for induction of labor due to Hypertension.  Subjective: Pushing efforts were not great and her temp rose to 100.8F  Objective: BP 133/84 (BP Location: Right Arm)   Pulse 98   Temp 100.2 F (37.9 C) (Oral)   Resp 18   Ht 5' (1.524 m)   Wt 97.5 kg Comment: 215lbs  SpO2 97%   BMI 41.99 kg/m  Vitals:   09/16/22 0210 09/16/22 0310 09/16/22 0410 09/16/22 0510  BP: 110/63 125/70 121/79 121/64   09/16/22 0610 09/16/22 0710 09/16/22 0902 09/16/22 0910  BP: 126/68 128/69 (!) 136/90 132/85   09/16/22 1010 09/16/22 1227 09/16/22 1508 09/16/22 1955  BP: 123/69 (!) 147/88 129/80 133/84     Fetal Assessment: FHT:  FHR: 125 bpm, variability: moderate,  accelerations:  Present,  decelerations:  Present variables and earlies Category/reactivity:  Category I UC:   regular, every 2-3 minutes SVE:    Dilation: 10cm  Effacement: 100%  Station:  +1  Consistency: soft  Position: anterior  Membrane status: AROM at 1315 Amniotic color: n/a  Labs: Lab Results  Component Value Date   WBC 10.5 09/15/2022   HGB 10.7 (L) 09/15/2022   HCT 33.9 (L) 09/15/2022   MCV 85.8 09/15/2022   PLT 250 09/15/2022    Assessment / Plan: Induction of labor due to gestational hypertension,  progressing well on pitocin 1045 Pitocin started 1750 Catheter balloon placed with 7mL NS 2030 Catheter balloon fell out 0055 Pitocin turned off 0200 Pitocin restarted  1315 AROM 1920 Complete and pushing Currently at 51mU - plan to labor down and treat the temp with Amp and Gent Dr. Leafy Ro up dated   Labor: Progressing normally Preeclampsia:  no signs or symptoms of toxicity, labs stable, and BPs noted above Fetal Wellbeing:  Category I Pain Control:  Epidural I/D:   Afebrile, GBS neg, AROM x7hrs Anticipated MOD:  NSVD  Clydene Doris Lowe, CNM 09/16/2022, 8:09 PM

## 2022-09-16 NOTE — Progress Notes (Signed)
Labor Progress Note  Doris Lowe is a 26 y.o. [id number] at [redacted]w[redacted]d by LMP admitted for induction of labor due to Hypertension.  Subjective: Pt is comfortable with an epidural, trying to get some sleep  Objective: BP 128/69 (BP Location: Right Arm)   Pulse 93   Temp 98 F (36.7 C) (Oral)   Resp 14   Ht 5' (1.524 m)   Wt 97.5 kg Comment: 215lbs  SpO2 97%   BMI 41.99 kg/m  Vitals:   09/15/22 2123 09/15/22 2126 09/15/22 2350 09/16/22 0004  BP: (!) 154/89 (!) 141/70 (!) 141/81 (!) 146/71   09/16/22 0010 09/16/22 0055 09/16/22 0210 09/16/22 0310  BP: (!) 140/71 103/78 110/63 125/70   09/16/22 0410 09/16/22 0510 09/16/22 0610 09/16/22 0710  BP: 121/79 121/64 126/68 128/69     Fetal Assessment: FHT:  FHR: 140 bpm, variability: moderate,  accelerations:  Present,  decelerations:  Absent Category/reactivity:  Category I UC:   regular, every 5-6 minutes SVE:    Dilation: 5cm  Effacement: 60%  Station:  -1  Consistency: soft  Position: anterior  Pt's cervical exam is unusual, very anterior and behind pubic bone  Membrane status: AROM- clear fluid Amniotic color: n/a  Labs: Lab Results  Component Value Date   WBC 10.5 09/15/2022   HGB 10.7 (L) 09/15/2022   HCT 33.9 (L) 09/15/2022   MCV 85.8 09/15/2022   PLT 250 09/15/2022    Assessment / Plan: Induction of labor due to gestational hypertension,  progressing well on pitocin 1045 Pitocin started 1750 Catheter balloon placed with 62mL NS 2030 Catheter balloon fell out  09/16/22: 0055 Pitocin turned off 0200 Pitocin restarted  Currently at 42mU  1315 AROM for clear fluid, copious amount   Labor:  Progressing on pitocin, AROM now for clear fluid Preeclampsia:  no signs or symptoms of toxicity, labs stable, and BPs noted above Fetal Wellbeing:  Category I Pain Control:  Epidural I/D:  Afebrile, GBS neg Anticipated MOD:  NSVD  Benjaman Kindler, CNM 09/16/2022, 1:19 PM        Patient ID: Doris Lowe, female    DOB: [birth year], 26 y.o.   MRN: [id number]

## 2022-09-16 NOTE — Discharge Summary (Signed)
Obstetrical Discharge Summary  Patient Name: Doris Lowe DOB: [birth year] MRN: [id number]  Date of Admission: 09/15/2022 Date of Delivery: 09/16/22 Delivered by: Haroldine Laws, CNM  Date of Discharge: 09/18/2022  Primary OB: [facility] LMP:No LMP recorded. Patient is pregnant. EDC Estimated Date of Delivery: 10/06/22 Gestational Age at Delivery: [redacted]w[redacted]d   Antepartum complications:  1. Obesity BMI: 36.33 2. Prior CS (G2) 2014 followed by VBAC (G4) 3. Anemia 4. Prior Pre-eclampsia - G2 5. Gestational Hypertension   Admitting Diagnosis: Gestational hypertension [O13.9]  Secondary Diagnosis: Patient Active Problem List   Diagnosis Date Noted   NSVD (normal spontaneous vaginal delivery) 09/16/2022   Gestational hypertension 09/15/2022   Gestational hypertension w/o significant proteinuria in 3rd trimester 09/13/2022   Pregnancy 09/11/2022   VBAC, delivered 08/28/2021   Chorioamnionitis 08/28/2021   Supervision of other high risk pregnancies, second trimester 02/06/2021   Iron deficiency anemia 06/19/2020   Vitamin D deficiency, unspecified 02/05/2017    Discharge Diagnosis: VBAC      Augmentation: AROM, Pitocin, and OP Foley Complications: None Intrapartum complications/course: Given Amp and Gent for temp of 100.2 F.  Delivery Type: vaginal birth after cesarean (VBAC) Anesthesia: epidural anesthesia Placenta: spontaneous To Pathology: No  Laceration: none Episiotomy: none Newborn Data: Live born female  Birth Weight:   3040g (6lb11.2oz). APGAR: 7, 8  Newborn Delivery   Birth date/time: 09/16/2022 21:46:00 Delivery type: Vaginal, Spontaneous      Postpartum Procedures:  iron infusion Edinburgh:     09/18/2022   10:00 AM 08/29/2021    9:31 PM  Edinburgh Postnatal Depression Scale Screening Tool  I have been able to laugh and see the funny side of things. 0 0  I have looked forward with enjoyment to things. 0 0  I have blamed myself unnecessarily when  things went wrong. 0 1  I have been anxious or worried for no good reason. 0 0  I have felt scared or panicky for no good reason. 0 0  Things have been getting on top of me. 0 1  I have been so unhappy that I have had difficulty sleeping. 0 1  I have felt sad or miserable. 0 1  I have been so unhappy that I have been crying. 0 1  The thought of harming myself has occurred to me. 0 0  Edinburgh Postnatal Depression Scale Total 0 5     Post partum course:  Patient had an uncomplicated postpartum course.  By time of discharge on PPD#2, her pain was controlled on oral pain medications; she had appropriate lochia and was ambulating, voiding without difficulty and tolerating regular diet.  She was deemed stable for discharge to home.    Discharge Physical Exam:  BP 136/81 (BP Location: Left Arm)   Pulse 98   Temp 98.5 F (36.9 C) (Oral)   Resp 20   Ht 5' (1.524 m)   Wt 97.5 kg Comment: 215lbs  SpO2 98%   BMI 41.99 kg/m   General: NAD CV: RRR Pulm: CTABL, nl effort ABD: s/nd/nt, fundus firm and below the umbilicus Lochia: moderate Perineum:minimal edema/intact DVT Evaluation: LE non-ttp, no evidence of DVT on exam.  Hemoglobin  Date Value Ref Range Status  09/18/2022 8.1 (L) 12.0 - 15.0 g/dL Final   HCT  Date Value Ref Range Status  09/18/2022 25.7 (L) 36.0 - 46.0 % Final    Risk assessment for postpartum VTE and prophylactic treatment: Very high risk factors: None High risk factors: If 1 risk factor, mechanical  prophylaxis and early ambulation  and BMI 40-50 kg/m2 Moderate risk factors: None  Postpartum VTE prophylaxis with LMWH not indicated  Disposition: stable, discharge to home. Baby Feeding: breast feeding Baby Disposition: home with mom  Rh Immune globulin indicated: No Rubella vaccine given: was not indicated Varivax vaccine given: was not indicated Flu vaccine given in AP setting: Yes  and No Tdap vaccine given in AP setting: Yes   Contraception:  IUD  Prenatal Labs:  Blood type/Rh B pos  Antibody screen neg  Rubella Immune  Varicella Immune  RPR NR  HBsAg Neg  HIV NR  GC neg  Chlamydia neg  Genetic screening negative  1 hour GTT 115  3 hour GTT    GBS collected    Plan:  Claiborne Billings Moskowitz was discharged to home in good condition.   Discharge Medications: Allergies as of 09/18/2022       Reactions   Sulfa Antibiotics Hives        Medication List     STOP taking these medications    aspirin 81 MG chewable tablet       TAKE these medications    acetaminophen 325 MG tablet Commonly known as: Tylenol Take 2 tablets (650 mg total) by mouth every 4 (four) hours as needed (for pain scale < 4).   benzocaine-Menthol 20-0.5 % Aero Commonly known as: DERMOPLAST Apply 1 Application topically as needed for irritation (perineal discomfort).   coconut oil Oil Apply 1 Application topically as needed.   diphenhydrAMINE 25 mg capsule Commonly known as: BENADRYL Take 1 capsule (25 mg total) by mouth every 6 (six) hours as needed for itching.   ferrous sulfate 325 (65 FE) MG tablet Take 1 tablet (325 mg total) by mouth 2 (two) times daily with a meal.   ibuprofen 600 MG tablet Commonly known as: ADVIL Take 1 tablet (600 mg total) by mouth every 6 (six) hours.   labetalol 200 MG tablet Commonly known as: NORMODYNE Take 1 tablet (200 mg total) by mouth every 12 (twelve) hours.   prenatal multivitamin Tabs tablet Take 1 tablet by mouth daily at 12 noon.   senna-docusate 8.6-50 MG tablet Commonly known as: Senokot-S Take 2 tablets by mouth daily. Start taking on: September 19, 2022   simethicone 80 MG chewable tablet Commonly known as: MYLICON Chew 1 tablet (80 mg total) by mouth as needed for flatulence.   witch hazel-glycerin pad Commonly known as: TUCKS Apply 1 Application topically as needed for hemorrhoids.         Follow-up Information     Gertie Fey, CNM. Go on 09/26/2022.    Specialty: Certified Nurse Midwife Why: BP check: Wednesday, 11/8 at 9:45am with Lucrezia Europe, CNM at [facility] information: [place] [address] [phone]         Linda Hedges, [place]. Go on 10/29/2022.   Specialty: Certified Nurse Midwife Why: Postpartum Follow-up appointment: Monday, 12/11 at 9:45am with Pamelia Hoit, CNM at [facility] information: [place] [address] [phone]         [facility] Follow up.   Why: BP check 2-3 days Contact information: [address] [place] [phone]                Signed: Francetta Found, CNM 09/18/2022 10:49 AM

## 2022-09-16 NOTE — Anesthesia Procedure Notes (Signed)
Epidural Patient location during procedure: OB Start time: 09/15/2022 11:40 AM End time: 09/15/2022 11:47 AM  Staffing Anesthesiologist: Tericka Devincenzi, Precious Haws, MD Performed: anesthesiologist   Preanesthetic Checklist Completed: patient identified, IV checked, site marked, risks and benefits discussed, surgical consent, monitors and equipment checked, pre-op evaluation and timeout performed  Epidural Patient position: sitting Prep: ChloraPrep Patient monitoring: heart rate, continuous pulse ox and blood pressure Approach: midline Location: L3-L4 Injection technique: LOR saline  Needle:  Needle type: Tuohy  Needle gauge: 17 G Needle length: 9 cm and 9 Needle insertion depth: 7 cm Catheter type: closed end flexible Catheter size: 19 Gauge Catheter at skin depth: 12 cm Test dose: negative and 1.5% lidocaine with Epi 1:200 K  Assessment Sensory level: T10 Events: blood not aspirated, injection not painful, no injection resistance, no paresthesia and negative IV test  Additional Notes 1 attempt Pt. Evaluated and documentation done after procedure finished. Patient identified. Risks/Benefits/Options discussed with patient including but not limited to bleeding, infection, nerve damage, paralysis, failed block, incomplete pain control, headache, blood pressure changes, nausea, vomiting, reactions to medication both or allergic, itching and postpartum back pain. Confirmed with bedside nurse the patient's most recent platelet count. Confirmed with patient that they are not currently taking any anticoagulation, have any bleeding history or any family history of bleeding disorders. Patient expressed understanding and wished to proceed. All questions were answered. Sterile technique was used throughout the entire procedure. Please see nursing notes for vital signs. Test dose was given through epidural catheter and negative prior to continuing to dose epidural or start infusion. Warning signs of  high block given to the patient including shortness of breath, tingling/numbness in hands, complete motor block, or any concerning symptoms with instructions to call for help. Patient was given instructions on fall risk and not to get out of bed. All questions and concerns addressed with instructions to call with any issues or inadequate analgesia.    Patient tolerated the insertion well without immediate complications.Reason for block:procedure for pain

## 2022-09-16 NOTE — Progress Notes (Signed)
Labor Progress Note  Margart Zemanek is a 26 y.o. [id number] at [redacted]w[redacted]d by LMP admitted for induction of labor due to Hypertension.  Subjective: Pt is worried about progress.  Reports rectal pressure with each contractions, managing well  Objective: BP 129/80   Pulse 80   Temp 98 F (36.7 C) (Oral)   Resp 14   Ht 5' (1.524 m)   Wt 97.5 kg Comment: 215lbs  SpO2 96%   BMI 41.99 kg/m  Vitals:   09/16/22 0055 09/16/22 0210 09/16/22 0310 09/16/22 0410  BP: 103/78 110/63 125/70 121/79   09/16/22 0510 09/16/22 0610 09/16/22 0710 09/16/22 0902  BP: 121/64 126/68 128/69 (!) 136/90   09/16/22 0910 09/16/22 1010 09/16/22 1227 09/16/22 1508  BP: 132/85 123/69 (!) 147/88 129/80     Fetal Assessment: FHT:  FHR: 125 bpm, variability: moderate,  accelerations:  Present,  decelerations:  Present earlies Category/reactivity:  Category I UC:   regular, every 2-3 minutes SVE:    Dilation: 8cm  Effacement: 100%  Station:  +1  Consistency: soft  Position: anterior  Membrane status: AROM at 1315 Amniotic color: n/a  Labs: Lab Results  Component Value Date   WBC 10.5 09/15/2022   HGB 10.7 (L) 09/15/2022   HCT 33.9 (L) 09/15/2022   MCV 85.8 09/15/2022   PLT 250 09/15/2022    Assessment / Plan: Induction of labor due to gestational hypertension,  progressing well on pitocin 1045 Pitocin started 1750 Catheter balloon placed with 74mL NS 2030 Catheter balloon fell out 0055 Pitocin turned off 0200 Pitocin restarted  1315 AROM Currently at 47mU    Labor: Progressing normally Preeclampsia:  no signs or symptoms of toxicity, labs stable, and BPs noted above Fetal Wellbeing:  Category I Pain Control:  Epidural I/D:   Afebrile, GBS neg, AROM x4hrs Anticipated MOD:  NSVD  Clydene Laming, CNM 09/16/2022, 6:02 PM

## 2022-09-16 NOTE — Plan of Care (Signed)
  Problem: Education: Goal: Knowledge of General Education information will improve Description: Including pain rating scale, medication(s)/side effects and non-pharmacologic comfort measures Outcome: Progressing   Problem: Health Behavior/Discharge Planning: Goal: Ability to manage health-related needs will improve Outcome: Progressing   Problem: Clinical Measurements: Goal: Ability to maintain clinical measurements within normal limits will improve Outcome: Progressing Goal: Will remain free from infection Outcome: Progressing Goal: Diagnostic test results will improve Outcome: Progressing Goal: Respiratory complications will improve Outcome: Progressing Goal: Cardiovascular complication will be avoided Outcome: Progressing   Problem: Activity: Goal: Risk for activity intolerance will decrease Outcome: Progressing   Problem: Nutrition: Goal: Adequate nutrition will be maintained Outcome: Progressing   Problem: Coping: Goal: Level of anxiety will decrease Outcome: Progressing   Problem: Elimination: Goal: Will not experience complications related to bowel motility Outcome: Progressing Goal: Will not experience complications related to urinary retention Outcome: Progressing   Problem: Pain Managment: Goal: General experience of comfort will improve Outcome: Progressing   Problem: Safety: Goal: Ability to remain free from injury will improve Outcome: Progressing   Problem: Skin Integrity: Goal: Risk for impaired skin integrity will decrease Outcome: Progressing   Problem: Education: Goal: Knowledge of disease or condition will improve Outcome: Progressing Goal: Knowledge of the prescribed therapeutic regimen will improve Outcome: Progressing   Problem: Fluid Volume: Goal: Peripheral tissue perfusion will improve Outcome: Progressing   Problem: Clinical Measurements: Goal: Complications related to disease process, condition or treatment will be avoided or  minimized Outcome: Progressing   Problem: Education: Goal: Knowledge of condition will improve Outcome: Progressing Goal: Individualized Educational Video(s) Outcome: Progressing Goal: Individualized Newborn Educational Video(s) Outcome: Progressing   Problem: Activity: Goal: Will verbalize the importance of balancing activity with adequate rest periods Outcome: Progressing Goal: Ability to tolerate increased activity will improve Outcome: Progressing   Problem: Coping: Goal: Ability to identify and utilize available resources and services will improve Outcome: Progressing   Problem: Life Cycle: Goal: Chance of risk for complications during the postpartum period will decrease Outcome: Progressing   Problem: Role Relationship: Goal: Ability to demonstrate positive interaction with newborn will improve Outcome: Progressing   Problem: Skin Integrity: Goal: Demonstration of wound healing without infection will improve Outcome: Progressing   

## 2022-09-16 NOTE — Progress Notes (Signed)
Labor Progress Note  Doris Lowe is a 26 y.o. [id number] at [redacted]w[redacted]d by LMP admitted for induction of labor due to Hypertension.  Subjective: Pt is comfortable with an epidural, trying to get some sleep  Objective: BP 121/79   Pulse 94   Temp 98.2 F (36.8 C) (Oral)   Resp 16   Ht 5' (1.524 m)   Wt 97.5 kg Comment: 215lbs  SpO2 95%   BMI 41.99 kg/m  Vitals:   09/15/22 1323 09/15/22 1444 09/15/22 1545 09/15/22 1637  BP: 124/60 139/74 114/67 130/86   09/15/22 2123 09/15/22 2126 09/15/22 2350 09/16/22 0004  BP: (!) 154/89 (!) 141/70 (!) 141/81 (!) 146/71   09/16/22 0010 09/16/22 0055 09/16/22 0310 09/16/22 0410  BP: (!) 140/71 103/78 125/70 121/79     Fetal Assessment: FHT:  FHR: 140 bpm, variability: moderate,  accelerations:  Present,  decelerations:  Absent Category/reactivity:  Category I UC:   regular, every 5-6 minutes SVE:    Dilation: 4cm  Effacement: 60%  Station:  -3  Consistency: soft  Position: anterior  Membrane status: Intact Amniotic color: n/a  Labs: Lab Results  Component Value Date   WBC 10.5 09/15/2022   HGB 10.7 (L) 09/15/2022   HCT 33.9 (L) 09/15/2022   MCV 85.8 09/15/2022   PLT 250 09/15/2022    Assessment / Plan: Induction of labor due to gestational hypertension,  progressing well on pitocin 1045 Pitocin started 1750 Catheter balloon placed with 53mL NS 2030 Catheter balloon fell out 0055 Pitocin turned off 0200 Pitocin restarted  Currently at 78mU    Labor: Progressing on Pitocin, will continue to increase then AROM Preeclampsia:  no signs or symptoms of toxicity, labs stable, and BPs noted above Fetal Wellbeing:  Category I Pain Control:  Epidural I/D:   Afebrile, GBS neg, Intact Anticipated MOD:  NSVD  Clydene Laming, CNM 09/16/2022, 6:09 AM

## 2022-09-17 LAB — CBC
HCT: 28.6 % — ABNORMAL LOW (ref 36.0–46.0)
Hemoglobin: 9.2 g/dL — ABNORMAL LOW (ref 12.0–15.0)
MCH: 27.6 pg (ref 26.0–34.0)
MCHC: 32.2 g/dL (ref 30.0–36.0)
MCV: 85.9 fL (ref 80.0–100.0)
Platelets: 217 10*3/uL (ref 150–400)
RBC: 3.33 MIL/uL — ABNORMAL LOW (ref 3.87–5.11)
RDW: 21 % — ABNORMAL HIGH (ref 11.5–15.5)
WBC: 17.7 10*3/uL — ABNORMAL HIGH (ref 4.0–10.5)
nRBC: 0 % (ref 0.0–0.2)

## 2022-09-17 MED ORDER — SENNOSIDES-DOCUSATE SODIUM 8.6-50 MG PO TABS
2.0000 | ORAL_TABLET | Freq: Every day | ORAL | Status: DC
  Administered 2022-09-17 – 2022-09-18 (×2): 2 via ORAL
  Filled 2022-09-17 (×2): qty 2

## 2022-09-17 MED ORDER — BENZOCAINE-MENTHOL 20-0.5 % EX AERO
1.0000 | INHALATION_SPRAY | CUTANEOUS | Status: DC | PRN
  Filled 2022-09-17: qty 56

## 2022-09-17 MED ORDER — SIMETHICONE 80 MG PO CHEW: 80.0000 mg | CHEWABLE_TABLET | ORAL | Status: DC | PRN

## 2022-09-17 MED ORDER — ONDANSETRON HCL 4 MG PO TABS: 4.0000 mg | ORAL_TABLET | ORAL | Status: DC | PRN

## 2022-09-17 MED ORDER — SODIUM CHLORIDE 0.9 % IV SOLN
300.0000 mg | Freq: Once | INTRAVENOUS | Status: DC
  Filled 2022-09-17: qty 15

## 2022-09-17 MED ORDER — OXYCODONE HCL 5 MG PO TABS: 5.0000 mg | ORAL_TABLET | ORAL | Status: DC | PRN

## 2022-09-17 MED ORDER — FERROUS SULFATE 325 (65 FE) MG PO TABS
325.0000 mg | ORAL_TABLET | Freq: Two times a day (BID) | ORAL | Status: DC
  Administered 2022-09-17 – 2022-09-18 (×3): 325 mg via ORAL
  Filled 2022-09-17 (×3): qty 1

## 2022-09-17 MED ORDER — PRENATAL MULTIVITAMIN CH
1.0000 | ORAL_TABLET | Freq: Every day | ORAL | Status: DC
  Administered 2022-09-17: 1 via ORAL
  Filled 2022-09-17: qty 1

## 2022-09-17 MED ORDER — DIPHENHYDRAMINE HCL 25 MG PO CAPS: 25.0000 mg | ORAL_CAPSULE | Freq: Four times a day (QID) | ORAL | Status: DC | PRN

## 2022-09-17 MED ORDER — WITCH HAZEL-GLYCERIN EX PADS
1.0000 | MEDICATED_PAD | CUTANEOUS | Status: DC | PRN
  Filled 2022-09-17: qty 100

## 2022-09-17 MED ORDER — COCONUT OIL OIL: 1.0000 | TOPICAL_OIL | Status: DC | PRN

## 2022-09-17 MED ORDER — IBUPROFEN 600 MG PO TABS
600.0000 mg | ORAL_TABLET | Freq: Four times a day (QID) | ORAL | Status: DC
  Administered 2022-09-17 – 2022-09-18 (×6): 600 mg via ORAL
  Filled 2022-09-17 (×6): qty 1

## 2022-09-17 MED ORDER — DIBUCAINE (PERIANAL) 1 % EX OINT: 1.0000 | TOPICAL_OINTMENT | CUTANEOUS | Status: DC | PRN

## 2022-09-17 MED ORDER — TETANUS-DIPHTH-ACELL PERTUSSIS 5-2.5-18.5 LF-MCG/0.5 IM SUSY: 0.5000 mL | PREFILLED_SYRINGE | Freq: Once | INTRAMUSCULAR | Status: AC

## 2022-09-17 MED ORDER — ACETAMINOPHEN 325 MG PO TABS
650.0000 mg | ORAL_TABLET | ORAL | Status: DC | PRN
  Administered 2022-09-17 (×2): 650 mg via ORAL
  Filled 2022-09-17 (×2): qty 2

## 2022-09-17 MED ORDER — LABETALOL HCL 200 MG PO TABS
200.0000 mg | ORAL_TABLET | Freq: Two times a day (BID) | ORAL | Status: DC
  Administered 2022-09-17 – 2022-09-18 (×3): 200 mg via ORAL
  Filled 2022-09-17 (×3): qty 1

## 2022-09-17 MED ORDER — ONDANSETRON HCL 4 MG/2ML IJ SOLN: 4.0000 mg | INTRAMUSCULAR | Status: DC | PRN

## 2022-09-17 MED ORDER — OXYCODONE HCL 5 MG PO TABS: 10.0000 mg | ORAL_TABLET | ORAL | Status: DC | PRN

## 2022-09-17 NOTE — Anesthesia Postprocedure Evaluation (Signed)
Anesthesia Post Note  Patient: [name], commercial  Procedure(s) Performed: AN AD HOC LABOR EPIDURAL  Patient location during evaluation: Mother Baby Anesthesia Type: Epidural Level of consciousness: awake and alert Pain management: pain level controlled Vital Signs Assessment: post-procedure vital signs reviewed and stable Respiratory status: spontaneous breathing, nonlabored ventilation and respiratory function stable Cardiovascular status: stable Postop Assessment: no headache, no backache and epidural receding Anesthetic complications: no   No notable events documented.   Last Vitals:  Vitals:   09/17/22 0352 09/17/22 0801  BP: (!) 145/84 (!) 140/87  Pulse: 88 96  Resp: 18 18  Temp: 36.9 C 37.1 C  SpO2:  100%    Last Pain:  Vitals:   09/17/22 0801  TempSrc: Oral  PainSc:                  [name]

## 2022-09-17 NOTE — Lactation Note (Signed)
This note was copied from a baby's chart. Lactation Consultation Note  Patient Name: Doris Lowe Today's Date: 09/17/2022 Reason for consult: Initial assessment;Early term 37-38.6wks Age:28 hours  Maternal Data Does the patient have breastfeeding experience prior to this delivery?: Yes How long did the patient breastfeed?: 8 months  P2, SVD 13hrs ago to LPT 37 week infant. Mom desires to pump and bottle feed; providing formula until milk is established.  Feeding Mother's Current Feeding Choice: Breast Milk and Formula  Feeding every 3 hours, appropriate volumes, tolerating formula well.  LATCH Score   No desire to put baby to the breast  Lactation Tools Discussed/Used Tools: Pump;Bottle Breast pump type: Double-Electric Breast Pump Pump Education: Setup, frequency, and cleaning;Milk Storage (reviewed again today) Reason for Pumping: desire to pump and bottle feed Pumping frequency: q 3 hours  Interventions Interventions: Breast feeding basics reviewed;Hand express;DEBP;Education (Feeding behaviors, feeding q 3 hours, stomach size/appropriate volumes, pumping routine/consistency, milk supply and demand and normal course of lactation)  Discharge Pump: Personal (Medela)  Consult Status Consult Status: PRN    Lavonia Drafts 09/17/2022, 11:34 AM

## 2022-09-17 NOTE — Progress Notes (Signed)
Postpartum Day  1  Subjective: no complaints, up ad lib, voiding, and tolerating PO  Doing well, no concerns. Ambulating without difficulty, pain managed with PO meds, tolerating regular diet, and voiding without difficulty.   No fever/chills, chest pain, shortness of breath, nausea/vomiting, or leg pain. No nipple or breast pain. No headache, visual changes, or RUQ/epigastric pain.  Objective: BP (!) 140/87 (BP Location: Left Arm)   Pulse 96   Temp 98.7 F (37.1 C) (Oral)   Resp 18   Ht 5' (1.524 m)   Wt 97.5 kg Comment: 215lbs  SpO2 100%   BMI 41.99 kg/m   Vitals:   09/16/22 2330 09/16/22 2345 09/17/22 0052 09/17/22 0107  BP: 139/84 135/81 136/77 138/77   09/17/22 0122 09/17/22 0137 09/17/22 0152 09/17/22 0207  BP: 130/76 129/75 134/72 132/73   09/17/22 0222 09/17/22 0300 09/17/22 0352 09/17/22 0801  BP: 133/68 134/79 (!) 145/84 (!) 140/87      Physical Exam:  General: alert, cooperative, and no distress Breasts: soft/nontender CV: RRR Pulm: nl effort, CTABL Abdomen: soft, non-tender, active bowel sounds Uterine Fundus: firm Perineum: minimal edema, intact Lochia: appropriate DVT Evaluation: No evidence of DVT seen on physical exam.  Recent Labs    09/15/22 1002 09/17/22 0524  HGB 10.7* 9.2*  HCT 33.9* 28.6*  WBC 10.5 17.7*  PLT 250 217    Assessment/Plan: 26 y.o. [id number] postpartum day # 1  -Continue routine postpartum care -Lactation consult PRN for breastfeeding  -Acute blood loss anemia - hemodynamically stable and asymptomatic; start PO ferrous sulfate BID with stool softeners  -Will offer 1 dose of IV Venofer prior to IV removal today - patient requesting IV removal and is OK with inserting new IV if needed.  -Check CBC in AM  -Immunization status:   all immunizations up to date -Blood pressures mildly elevated, no severe ranges.  Discussed plan of care with Dr. Glennon Mac.  Will start on Labetolol BID d/t negative side effects with procardia after  her last pregnancy.    Disposition: Continue inpatient postpartum care   LOS: 2 days   Minda Meo, CNM 09/17/2022, 9:29 AM   ----- Drinda Butts  Certified Nurse Midwife [place] [facility]

## 2022-09-17 NOTE — Discharge Instructions (Addendum)
Discharge Instructions:   BP check: Wednesday, 11/8 at 9:45am with Lucrezia Europe, CNM at [facility] Postpartum Follow-up appointment: Monday, 12/11 at 9:45am with Pamelia Hoit, CNM at [facility]   If there are any new medications, they have been ordered and will be available for pickup at the listed pharmacy on your way home from the hospital.   Call office if you have any of the following: headache, visual changes, fever >101.0 F, chills, shortness of breath, breast concerns, excessive vaginal bleeding, incision drainage or problems, leg pain or redness, depression or any other concerns. If you have vaginal discharge with an odor, let your doctor know.   It is normal to bleed for up to 6 weeks. You should not soak through more than 1 pad in 1 hour. If you have a blood clot larger than your fist with continued bleeding, call your doctor.   Activity: Do not lift > 10 lbs for 6 weeks (do not lift anything heavier than your baby). No intercourse, tampons, swimming pools, hot tubs, baths (only showers) for 6 weeks.  No driving for 1-2 weeks. Continue prenatal vitamin, especially if breastfeeding. Increase calories and fluids (water) while breastfeeding.   Your milk will come in, in the next couple of days (right now it is colostrum). You may have a slight fever when your milk comes in, but it should go away on its own.  If it does not, and rises above 101 F please call the doctor. You will also feel achy and your breasts will be firm. They will also start to leak. If you are breastfeeding, continue as you have been and you can pump/express milk for comfort.   If you have too much milk, your breasts can become engorged, which could lead to mastitis. This is an infection of the milk ducts. It can be very painful and you will need to notify your doctor to obtain a prescription for antibiotics. You can also treat it with a shower or hot/cold compress.   For concerns about your baby, please  call your pediatrician.  For breastfeeding concerns, the lactation consultant can be reached at [phone].   Postpartum blues (feelings of happy one minute and sad another minute) are normal for the first few weeks but if it gets worse let your doctor know.   Congratulations! We enjoyed caring for you and your new bundle of joy!

## 2022-09-18 ENCOUNTER — Encounter: Payer: Self-pay | Admitting: *Deleted

## 2022-09-18 LAB — CBC
HCT: 25.7 % — ABNORMAL LOW (ref 36.0–46.0)
Hemoglobin: 8.1 g/dL — ABNORMAL LOW (ref 12.0–15.0)
MCH: 27.3 pg (ref 26.0–34.0)
MCHC: 31.5 g/dL (ref 30.0–36.0)
MCV: 86.5 fL (ref 80.0–100.0)
Platelets: 229 10*3/uL (ref 150–400)
RBC: 2.97 MIL/uL — ABNORMAL LOW (ref 3.87–5.11)
RDW: 21.5 % — ABNORMAL HIGH (ref 11.5–15.5)
WBC: 10.9 10*3/uL — ABNORMAL HIGH (ref 4.0–10.5)
nRBC: 0 % (ref 0.0–0.2)

## 2022-09-18 MED ORDER — SIMETHICONE 80 MG PO CHEW: 80.0000 mg | CHEWABLE_TABLET | ORAL | 0 refills | Status: AC | PRN

## 2022-09-18 MED ORDER — SENNOSIDES-DOCUSATE SODIUM 8.6-50 MG PO TABS: 2.0000 | ORAL_TABLET | Freq: Every day | ORAL | 0 refills | Status: AC

## 2022-09-18 MED ORDER — IBUPROFEN 600 MG PO TABS: 600.0000 mg | ORAL_TABLET | Freq: Four times a day (QID) | ORAL | 0 refills | Status: AC

## 2022-09-18 MED ORDER — FERROUS SULFATE 325 (65 FE) MG PO TABS: 325.0000 mg | ORAL_TABLET | Freq: Two times a day (BID) | ORAL | 0 refills | Status: AC

## 2022-09-18 MED ORDER — DIPHENHYDRAMINE HCL 25 MG PO CAPS: 25.0000 mg | ORAL_CAPSULE | Freq: Four times a day (QID) | ORAL | 0 refills | Status: AC | PRN

## 2022-09-18 MED ORDER — BENZOCAINE-MENTHOL 20-0.5 % EX AERO: 1.0000 | INHALATION_SPRAY | CUTANEOUS | Status: AC | PRN

## 2022-09-18 MED ORDER — LABETALOL HCL 200 MG PO TABS: 200.0000 mg | ORAL_TABLET | Freq: Two times a day (BID) | ORAL | 0 refills | Status: AC

## 2022-09-18 MED ORDER — WITCH HAZEL-GLYCERIN EX PADS: 1.0000 | MEDICATED_PAD | CUTANEOUS | 12 refills | Status: AC | PRN

## 2022-09-18 MED ORDER — COCONUT OIL OIL: 1.0000 | TOPICAL_OIL | 0 refills | Status: AC | PRN

## 2022-09-18 NOTE — Progress Notes (Signed)
D/C home via wheelchair to car/verb understanding of d/c instructions

## 2022-09-18 NOTE — Lactation Note (Signed)
This note was copied from a baby's chart. Lactation Consultation Note  Patient Name: Doris Lowe [id number] Date: 09/18/2022 Reason for consult: Follow-up assessment;Term;Exclusive pumping and bottle feeding Age:28 hours  Maternal Data Has patient been taught Hand Expression?: Yes Does the patient have breastfeeding experience prior to this delivery?: Yes How long did the patient breastfeed?: 8 months  Feeding Mother's Current Feeding Choice: Breast Milk and Formula  Currently receiving only formula- mom is pumping to bring in breast milk and provide EBM via bottle  LATCH Score Mom's choice to pump and bottle feed  Lactation Tools Discussed/Used Tools: Pump;Bottle Breast pump type: Double-Electric Breast Pump Reason for Pumping: mom's choice to pump/bottle feed Pumping frequency: q 3 hours  Reviewed importance of consistency, hands on pumping, hand expression pre/post pumping.  Interventions Interventions: DEBP;Education;Pace feeding  Educated and demonstrated pace bottle feeding to help minimize over feeding and feeding discomfort in baby.  Discharge Discharge Education: Engorgement and breast care;Warning signs for feeding baby;Outpatient recommendation Pump: Personal  Reviewed management of breast fullness and engorgement, ice for swelling  Consult Status Consult Status: Complete  Outpatient lactation number provided; encouraged to call with questions/concerns.  Lavonia Drafts 09/18/2022, 9:39 AM

## 2022-09-24 ENCOUNTER — Encounter: Payer: Self-pay | Admitting: Obstetrics and Gynecology
